# Patient Record
Sex: Male | Born: 1965
Health system: Southern US, Community
[De-identification: ages and names within clinical notes are randomized; demographics above are authoritative.]

## PROBLEM LIST (undated history)

## (undated) DIAGNOSIS — I82409 Acute embolism and thrombosis of unspecified deep veins of unspecified lower extremity: Secondary | ICD-10-CM

## (undated) DIAGNOSIS — E785 Hyperlipidemia, unspecified: Secondary | ICD-10-CM

## (undated) DIAGNOSIS — M109 Gout, unspecified: Secondary | ICD-10-CM

## (undated) DIAGNOSIS — I10 Essential (primary) hypertension: Secondary | ICD-10-CM

## (undated) HISTORY — PX: COLONOSCOPY: SHX174

## (undated) HISTORY — PX: COLON SURGERY: SHX602

## (undated) HISTORY — DX: Essential (primary) hypertension: I10

## (undated) HISTORY — DX: Hyperlipidemia, unspecified: E78.5

## (undated) HISTORY — DX: Gout, unspecified: M10.9

## (undated) HISTORY — PX: CYST EXCISION: SHX5701

## (undated) HISTORY — DX: Acute embolism and thrombosis of unspecified deep veins of unspecified lower extremity: I82.409

---

## 2001-11-05 ENCOUNTER — Emergency Department (HOSPITAL_COMMUNITY): Admission: EM | Admit: 2001-11-05 | Discharge: 2001-11-05 | Payer: Self-pay | Admitting: Emergency Medicine

## 2001-11-07 ENCOUNTER — Other Ambulatory Visit: Admission: RE | Admit: 2001-11-07 | Discharge: 2001-11-07 | Payer: Self-pay | Admitting: Dermatology

## 2001-12-23 ENCOUNTER — Other Ambulatory Visit: Admission: RE | Admit: 2001-12-23 | Discharge: 2001-12-23 | Payer: Self-pay | Admitting: Dermatology

## 2008-05-29 DIAGNOSIS — I2699 Other pulmonary embolism without acute cor pulmonale: Secondary | ICD-10-CM

## 2008-05-29 DIAGNOSIS — I82409 Acute embolism and thrombosis of unspecified deep veins of unspecified lower extremity: Secondary | ICD-10-CM

## 2008-05-29 HISTORY — DX: Other pulmonary embolism without acute cor pulmonale: I26.99

## 2008-05-29 HISTORY — DX: Acute embolism and thrombosis of unspecified deep veins of unspecified lower extremity: I82.409

## 2008-07-06 ENCOUNTER — Emergency Department (HOSPITAL_COMMUNITY): Admission: EM | Admit: 2008-07-06 | Discharge: 2008-07-06 | Payer: Self-pay | Admitting: Emergency Medicine

## 2008-12-06 ENCOUNTER — Ambulatory Visit: Payer: Self-pay | Admitting: Cardiology

## 2009-01-19 ENCOUNTER — Ambulatory Visit: Payer: Self-pay | Admitting: Radiology

## 2009-01-19 ENCOUNTER — Emergency Department (HOSPITAL_BASED_OUTPATIENT_CLINIC_OR_DEPARTMENT_OTHER): Admission: EM | Admit: 2009-01-19 | Discharge: 2009-01-19 | Payer: Self-pay | Admitting: Emergency Medicine

## 2010-09-03 LAB — PROTIME-INR: INR: 2 — ABNORMAL HIGH (ref 0.00–1.49)

## 2013-05-19 ENCOUNTER — Telehealth: Payer: Self-pay | Admitting: Family Medicine

## 2013-05-19 NOTE — Telephone Encounter (Signed)
appt given  

## 2013-05-21 ENCOUNTER — Ambulatory Visit (INDEPENDENT_AMBULATORY_CARE_PROVIDER_SITE_OTHER): Payer: BC Managed Care – PPO | Admitting: General Practice

## 2013-05-21 ENCOUNTER — Encounter: Payer: Self-pay | Admitting: General Practice

## 2013-05-21 VITALS — BP 155/78 | HR 71 | Temp 98.2°F | Ht 71.0 in | Wt 335.0 lb

## 2013-05-21 DIAGNOSIS — I10 Essential (primary) hypertension: Secondary | ICD-10-CM

## 2013-05-21 MED ORDER — LISINOPRIL 10 MG PO TABS: 10.0000 mg | ORAL_TABLET | Freq: Every day | ORAL | Status: DC

## 2013-05-21 MED ORDER — HYDROCHLOROTHIAZIDE 25 MG PO TABS: 25.0000 mg | ORAL_TABLET | Freq: Every day | ORAL | Status: DC

## 2013-05-21 NOTE — Progress Notes (Signed)
   Subjective:    Patient ID: Jeffrey Stark, male    DOB: 1965-12-30, 47 y.o.   MRN: [id number]  HPI Patient present today for follow up of blood pressure. He was recently diagnosed with HTN and started on hctz 12.5mg  daily. Reports blood pressure being checked and ranges 160's/90-106. Reports eating a healthy diet and denies regular exercise.       Review of Systems  Constitutional: Negative for fever and chills.  Respiratory: Negative for chest tightness and shortness of breath.   Cardiovascular: Negative for chest pain and palpitations.  All other systems reviewed and are negative.       Objective:   Physical Exam  Constitutional: He is oriented to person, place, and time. He appears well-developed and well-nourished.  obese  HENT:  Head: Normocephalic and atraumatic.  Right Ear: External ear normal.  Left Ear: External ear normal.  Mouth/Throat: Oropharynx is clear and moist.  Eyes: EOM are normal. Pupils are equal, round, and reactive to light.  Neck: Normal range of motion. Neck supple. No thyromegaly present.  Cardiovascular: Normal rate, regular rhythm and normal heart sounds.   Pulmonary/Chest: Effort normal and breath sounds normal. No respiratory distress. He exhibits no tenderness.  Abdominal: Soft. Bowel sounds are normal. He exhibits no distension. There is no tenderness.  Obese abdomen  Lymphadenopathy:    He has no cervical adenopathy.  Neurological: He is alert and oriented to person, place, and time.  Skin: Skin is warm and dry.  Psychiatric: He has a normal mood and affect.          Assessment & Plan:  1. HTN (hypertension)  - CMP14+EGFR - lisinopril (PRINIVIL,ZESTRIL) 10 MG tablet; Take 1 tablet (10 mg total) by mouth daily.  Dispense: 30 tablet; Refill: 0 - hydrochlorothiazide (HYDRODIURIL) 25 MG tablet; Take 1 tablet (25 mg total) by mouth daily.  Dispense: 30 tablet; Refill: 0 - CBC with Differential -follow up in 2 weeks for blood pressure  recheck  -discussed healthy eating and regular exercise Patient verbalized understanding Coralie Keens, FNP-C

## 2013-05-21 NOTE — Patient Instructions (Signed)

## 2013-05-22 LAB — CMP14+EGFR
Albumin/Globulin Ratio: 1.7 (ref 1.1–2.5)
Calcium: 9.8 mg/dL (ref 8.7–10.2)
Creatinine, Ser: 1.16 mg/dL (ref 0.76–1.27)
GFR calc non Af Amer: 74 mL/min/{1.73_m2} (ref >59)
Globulin, Total: 2.7 g/dL (ref 1.5–4.5)
Sodium: 142 mmol/L (ref 134–144)
Total Protein: 7.3 g/dL (ref 6.0–8.5)

## 2013-05-22 LAB — CBC WITH DIFFERENTIAL/PLATELET
Basophils Absolute: 0 10*3/uL (ref 0.0–0.2)
Eosinophils Absolute: 0 10*3/uL (ref 0.0–0.4)
HCT: 46.3 % (ref 37.5–51.0)
Hemoglobin: 15.3 g/dL (ref 12.6–17.7)
Lymphocytes Absolute: 2.4 10*3/uL (ref 0.7–3.1)
MCH: 27.7 pg (ref 26.6–33.0)
MCHC: 33 g/dL (ref 31.5–35.7)
Monocytes Absolute: 0.6 10*3/uL (ref 0.1–0.9)
Neutrophils Absolute: 3.3 10*3/uL (ref 1.4–7.0)

## 2013-05-26 ENCOUNTER — Telehealth: Payer: Self-pay | Admitting: General Practice

## 2013-05-26 ENCOUNTER — Encounter: Payer: Self-pay | Admitting: General Practice

## 2013-05-26 DIAGNOSIS — I10 Essential (primary) hypertension: Secondary | ICD-10-CM | POA: Insufficient documentation

## 2013-05-26 NOTE — Telephone Encounter (Signed)
Message left for Jeffrey Stark and letter ready for fax, when she provides fax number.

## 2013-05-30 NOTE — Telephone Encounter (Signed)
Spoke with Jeffrey Stark and patient.

## 2013-06-06 ENCOUNTER — Encounter: Payer: Self-pay | Admitting: General Practice

## 2013-06-06 ENCOUNTER — Ambulatory Visit (INDEPENDENT_AMBULATORY_CARE_PROVIDER_SITE_OTHER): Payer: BC Managed Care – PPO | Admitting: General Practice

## 2013-06-06 VITALS — BP 140/74 | HR 70 | Temp 98.2°F | Ht 71.0 in | Wt 338.5 lb

## 2013-06-06 DIAGNOSIS — I10 Essential (primary) hypertension: Secondary | ICD-10-CM

## 2013-06-06 MED ORDER — HYDROCHLOROTHIAZIDE 25 MG PO TABS: 25.0000 mg | ORAL_TABLET | Freq: Every day | ORAL | Status: DC

## 2013-06-06 MED ORDER — LISINOPRIL 10 MG PO TABS: 10.0000 mg | ORAL_TABLET | Freq: Every day | ORAL | Status: DC

## 2013-06-06 NOTE — Progress Notes (Signed)
   Subjective:    Patient ID: Jeffrey Stark, male    DOB: 1965/06/01, 48 y.o.   MRN: [id number]  HPI Patient presents today for blood pressure recheck. Checking blood pressure twice daily and ranges 120's-140/70-80. Denies regular exercise and reports trying to eat healthier. Patient brought his own blood pressure cuff in to see if correlates with office readings.     Review of Systems  Constitutional: Negative for fever and chills.  Respiratory: Negative for cough, chest tightness and shortness of breath.   Cardiovascular: Negative for chest pain, palpitations and leg swelling.  Neurological: Negative for dizziness, weakness and headaches.  All other systems reviewed and are negative.       Objective:   Physical Exam  Constitutional: He is oriented to person, place, and time. He appears well-developed and well-nourished.  Morbidly obese  Cardiovascular: Normal rate, regular rhythm and normal heart sounds.   Pulmonary/Chest: Effort normal and breath sounds normal. No respiratory distress. He exhibits no tenderness.  Neurological: He is alert and oriented to person, place, and time.  Skin: Skin is warm and dry.  Psychiatric: He has a normal mood and affect.          Assessment & Plan:  1. HTN (hypertension) - lisinopril (PRINIVIL,ZESTRIL) 10 MG tablet; Take 1 tablet (10 mg total) by mouth daily.  Dispense: 30 tablet; Refill: 5 - hydrochlorothiazide (HYDRODIURIL) 25 MG tablet; Take 1 tablet (25 mg total) by mouth daily.  Dispense: 30 tablet; Refill: 5 -office cuff reading 137/78 and home cuff correlated with 140/74 -Patient education discussed and provided (exercise to lose weight) -follow up in 3 months for chronic health -Patient verbalized understanding Erby Pian, FNP-C

## 2013-06-06 NOTE — Patient Instructions (Signed)

## 2013-06-11 ENCOUNTER — Telehealth: Payer: Self-pay | Admitting: *Deleted

## 2013-06-11 NOTE — Telephone Encounter (Signed)
Please call [phone]  (re-hab), to have them set a time to test him so he can get back to work.

## 2013-06-11 NOTE — Telephone Encounter (Signed)
Called and message left for return call.

## 2013-06-11 NOTE — Telephone Encounter (Signed)
Patient will call re-hab and find out if they will test him, if not,  He will call Ms. Haliburton to have her speak with them .

## 2013-06-14 NOTE — Telephone Encounter (Signed)
Letter faxed to re-hab and vm was also left for him to assist this patient to return to work.

## 2013-08-19 ENCOUNTER — Encounter: Payer: Self-pay | Admitting: *Deleted

## 2013-09-03 ENCOUNTER — Ambulatory Visit: Payer: BC Managed Care – PPO | Admitting: General Practice

## 2013-09-05 ENCOUNTER — Ambulatory Visit: Payer: BC Managed Care – PPO | Admitting: General Practice

## 2013-09-22 ENCOUNTER — Encounter: Payer: Self-pay | Admitting: General Practice

## 2013-09-22 ENCOUNTER — Ambulatory Visit (INDEPENDENT_AMBULATORY_CARE_PROVIDER_SITE_OTHER): Payer: BC Managed Care – PPO | Admitting: General Practice

## 2013-09-22 VITALS — BP 126/78 | HR 66 | Temp 97.5°F | Ht 71.0 in | Wt 334.0 lb

## 2013-09-22 DIAGNOSIS — I10 Essential (primary) hypertension: Secondary | ICD-10-CM

## 2013-09-22 MED ORDER — HYDROCHLOROTHIAZIDE 25 MG PO TABS: 25.0000 mg | ORAL_TABLET | Freq: Every day | ORAL | Status: DC

## 2013-09-22 MED ORDER — LISINOPRIL 10 MG PO TABS: 10.0000 mg | ORAL_TABLET | Freq: Every day | ORAL | Status: DC

## 2013-09-22 NOTE — Patient Instructions (Signed)

## 2013-09-22 NOTE — Progress Notes (Signed)
   Subjective:    Patient ID: Jeffrey Stark, male    DOB: Jun 15, 1965, 48 y.o.   MRN: [id number]  HPI Patient presents today for follow up of hypertension. Taking medications as prescribed. Eating healthy and plans to begin exercise regimen.     Review of Systems  Constitutional: Negative for fever and chills.  Respiratory: Negative for chest tightness and shortness of breath.   Cardiovascular: Negative for chest pain and palpitations.  Gastrointestinal: Negative for nausea, vomiting, abdominal pain, diarrhea, constipation and blood in stool.  Genitourinary: Negative for hematuria and difficulty urinating.  Neurological: Negative for dizziness, weakness and headaches.       Objective:   Physical Exam  Constitutional: He is oriented to person, place, and time. He appears well-developed and well-nourished.  obese  HENT:  Head: Normocephalic and atraumatic.  Right Ear: External ear normal.  Left Ear: External ear normal.  Mouth/Throat: Oropharynx is clear and moist.  Eyes: Pupils are equal, round, and reactive to light.  Neck: Normal range of motion. Neck supple. No thyromegaly present.  Cardiovascular: Normal rate, regular rhythm and normal heart sounds.   Pulmonary/Chest: Effort normal and breath sounds normal. No respiratory distress. He exhibits no tenderness.  Abdominal: Soft. Bowel sounds are normal. He exhibits no distension. There is no tenderness.  Lymphadenopathy:    He has no cervical adenopathy.  Neurological: He is alert and oriented to person, place, and time.  Skin: Skin is warm and dry.  Psychiatric: He has a normal mood and affect.          Assessment & Plan:  1. Hypertension - CMP14+EGFR; Future - Lipid panel; Future - lisinopril (PRINIVIL,ZESTRIL) 10 MG tablet; Take 1 tablet (10 mg total) by mouth daily.  Dispense: 30 tablet; Refill: 5 - hydrochlorothiazide (HYDRODIURIL) 25 MG tablet; Take 1 tablet (25 mg total) by mouth daily.  Dispense: 30 tablet;  Refill: 5 -Continue all current medications Labs pending F/u in 3 months Discussed benefits of regular exercise and healthy eating Patient verbalized understanding Erby Pian, FNP-C

## 2013-09-25 ENCOUNTER — Encounter: Payer: Self-pay | Admitting: General Practice

## 2013-09-26 ENCOUNTER — Other Ambulatory Visit (INDEPENDENT_AMBULATORY_CARE_PROVIDER_SITE_OTHER): Payer: BC Managed Care – PPO

## 2013-09-26 DIAGNOSIS — I10 Essential (primary) hypertension: Secondary | ICD-10-CM

## 2013-09-26 NOTE — Progress Notes (Signed)
Pt came in for labs only 

## 2013-09-27 LAB — LIPID PANEL
CHOL/HDL RATIO: 5.4 ratio — AB (ref 0.0–5.0)
Cholesterol, Total: 256 mg/dL — ABNORMAL HIGH (ref 100–199)
HDL: 47 mg/dL (ref >39)
LDL Calculated: 185 mg/dL — ABNORMAL HIGH (ref 0–99)
TRIGLYCERIDES: 119 mg/dL (ref 0–149)
VLDL CHOLESTEROL CAL: 24 mg/dL (ref 5–40)

## 2013-09-27 LAB — CMP14+EGFR
A/G RATIO: 1.6 (ref 1.1–2.5)
ALT: 15 IU/L (ref 0–44)
AST: 18 IU/L (ref 0–40)
Albumin: 4.4 g/dL (ref 3.5–5.5)
Alkaline Phosphatase: 86 IU/L (ref 39–117)
BUN/Creatinine Ratio: 14 (ref 9–20)
BUN: 15 mg/dL (ref 6–24)
CALCIUM: 9.4 mg/dL (ref 8.7–10.2)
CO2: 23 mmol/L (ref 18–29)
CREATININE: 1.07 mg/dL (ref 0.76–1.27)
Chloride: 98 mmol/L (ref 97–108)
GFR calc Af Amer: 94 mL/min/{1.73_m2} (ref >59)
GFR, EST NON AFRICAN AMERICAN: 81 mL/min/{1.73_m2} (ref >59)
GLOBULIN, TOTAL: 2.7 g/dL (ref 1.5–4.5)
GLUCOSE: 96 mg/dL (ref 65–99)
Potassium: 4.1 mmol/L (ref 3.5–5.2)
SODIUM: 140 mmol/L (ref 134–144)
TOTAL PROTEIN: 7.1 g/dL (ref 6.0–8.5)
Total Bilirubin: 0.4 mg/dL (ref 0.0–1.2)

## 2014-04-21 ENCOUNTER — Other Ambulatory Visit: Payer: Self-pay

## 2014-04-21 MED ORDER — HYDROCHLOROTHIAZIDE 25 MG PO TABS: 25.0000 mg | ORAL_TABLET | Freq: Every day | ORAL | Status: DC

## 2014-04-21 MED ORDER — LISINOPRIL 10 MG PO TABS: 10.0000 mg | ORAL_TABLET | Freq: Every day | ORAL | Status: DC

## 2014-04-21 NOTE — Telephone Encounter (Signed)
Last ov 4/15. 

## 2014-04-21 NOTE — Telephone Encounter (Signed)
This is okay to refill, but please make sure that patient gets an appointment to be seen for follow-up as soon as possible.

## 2014-04-28 ENCOUNTER — Telehealth: Payer: Self-pay | Admitting: Family Medicine

## 2014-06-01 ENCOUNTER — Telehealth: Payer: Self-pay | Admitting: Family Medicine

## 2014-06-01 MED ORDER — LISINOPRIL 10 MG PO TABS: 10.0000 mg | ORAL_TABLET | Freq: Every day | ORAL | Status: DC

## 2014-06-01 MED ORDER — HYDROCHLOROTHIAZIDE 25 MG PO TABS: 25.0000 mg | ORAL_TABLET | Freq: Every day | ORAL | Status: DC

## 2014-06-01 NOTE — Telephone Encounter (Signed)
DONE

## 2014-06-12 ENCOUNTER — Ambulatory Visit: Payer: BLUE CROSS/BLUE SHIELD | Admitting: Family Medicine

## 2014-07-06 ENCOUNTER — Ambulatory Visit: Payer: BLUE CROSS/BLUE SHIELD | Admitting: Family Medicine

## 2014-07-13 ENCOUNTER — Ambulatory Visit: Payer: BLUE CROSS/BLUE SHIELD | Admitting: Family Medicine

## 2014-07-13 ENCOUNTER — Telehealth: Payer: Self-pay | Admitting: *Deleted

## 2014-07-20 ENCOUNTER — Other Ambulatory Visit: Payer: Self-pay | Admitting: Family Medicine

## 2014-07-27 ENCOUNTER — Other Ambulatory Visit: Payer: Self-pay | Admitting: Family Medicine

## 2014-07-27 MED ORDER — HYDROCHLOROTHIAZIDE 25 MG PO TABS: 25.0000 mg | ORAL_TABLET | Freq: Every day | ORAL | Status: DC

## 2014-07-27 NOTE — Telephone Encounter (Signed)
done

## 2014-08-10 ENCOUNTER — Ambulatory Visit: Payer: BLUE CROSS/BLUE SHIELD | Admitting: Family Medicine

## 2014-08-27 ENCOUNTER — Ambulatory Visit (INDEPENDENT_AMBULATORY_CARE_PROVIDER_SITE_OTHER): Payer: BLUE CROSS/BLUE SHIELD | Admitting: Family Medicine

## 2014-08-27 ENCOUNTER — Other Ambulatory Visit: Payer: Self-pay | Admitting: Family Medicine

## 2014-08-27 ENCOUNTER — Encounter: Payer: Self-pay | Admitting: Family Medicine

## 2014-08-27 VITALS — BP 126/78 | HR 86 | Temp 98.8°F | Ht 71.0 in | Wt 297.0 lb

## 2014-08-27 DIAGNOSIS — I10 Essential (primary) hypertension: Secondary | ICD-10-CM | POA: Diagnosis not present

## 2014-08-27 DIAGNOSIS — Z0000 Encounter for general adult medical examination without abnormal findings: Secondary | ICD-10-CM

## 2014-08-27 MED ORDER — HYDROCHLOROTHIAZIDE 25 MG PO TABS: 25.0000 mg | ORAL_TABLET | Freq: Every day | ORAL | Status: DC

## 2014-08-27 MED ORDER — LISINOPRIL 10 MG PO TABS
10.0000 mg | ORAL_TABLET | Freq: Every day | ORAL | Status: DC
Start: 2014-08-27 — End: 2015-12-09
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
## 2014-08-28 ENCOUNTER — Other Ambulatory Visit: Payer: Self-pay | Admitting: Family Medicine

## 2014-08-28 LAB — CBC WITH DIFFERENTIAL/PLATELET
BASOS ABS: 0 10*3/uL (ref 0.0–0.2)
BASOS: 0 %
EOS ABS: 0.1 10*3/uL (ref 0.0–0.4)
Eos: 1 %
HCT: 42.7 % (ref 37.5–51.0)
HEMOGLOBIN: 14 g/dL (ref 12.6–17.7)
IMMATURE GRANS (ABS): 0 10*3/uL (ref 0.0–0.1)
Immature Granulocytes: 0 %
LYMPHS: 29 %
Lymphocytes Absolute: 2.5 10*3/uL (ref 0.7–3.1)
MCH: 26.6 pg (ref 26.6–33.0)
MCHC: 32.8 g/dL (ref 31.5–35.7)
MCV: 81 fL (ref 79–97)
MONOS ABS: 0.6 10*3/uL (ref 0.1–0.9)
Monocytes: 7 %
NEUTROS ABS: 5.5 10*3/uL (ref 1.4–7.0)
NEUTROS PCT: 63 %
Platelets: 329 10*3/uL (ref 150–379)
RBC: 5.27 x10E6/uL (ref 4.14–5.80)
RDW: 15.5 % — AB (ref 12.3–15.4)
WBC: 8.6 10*3/uL (ref 3.4–10.8)

## 2014-08-28 LAB — THYROID PANEL WITH TSH
FREE THYROXINE INDEX: 2.2 (ref 1.2–4.9)
T3 UPTAKE RATIO: 25 % (ref 24–39)
T4, Total: 8.8 ug/dL (ref 4.5–12.0)
TSH: 0.816 u[IU]/mL (ref 0.450–4.500)

## 2014-08-28 LAB — CMP14+EGFR
ALK PHOS: 98 IU/L (ref 39–117)
ALT: 15 IU/L (ref 0–44)
AST: 17 IU/L (ref 0–40)
Albumin/Globulin Ratio: 1.5 (ref 1.1–2.5)
Albumin: 4.5 g/dL (ref 3.5–5.5)
BILIRUBIN TOTAL: 0.3 mg/dL (ref 0.0–1.2)
BUN / CREAT RATIO: 13 (ref 9–20)
BUN: 14 mg/dL (ref 6–24)
CHLORIDE: 100 mmol/L (ref 97–108)
CO2: 24 mmol/L (ref 18–29)
Calcium: 9.8 mg/dL (ref 8.7–10.2)
Creatinine, Ser: 1.09 mg/dL (ref 0.76–1.27)
GFR calc non Af Amer: 79 mL/min/{1.73_m2} (ref >59)
GFR, EST AFRICAN AMERICAN: 91 mL/min/{1.73_m2} (ref >59)
Globulin, Total: 3.1 g/dL (ref 1.5–4.5)
Glucose: 102 mg/dL — ABNORMAL HIGH (ref 65–99)
POTASSIUM: 4 mmol/L (ref 3.5–5.2)
SODIUM: 141 mmol/L (ref 134–144)
Total Protein: 7.6 g/dL (ref 6.0–8.5)

## 2014-08-28 LAB — LIPID PANEL
CHOL/HDL RATIO: 5.3 ratio — AB (ref 0.0–5.0)
CHOLESTEROL TOTAL: 270 mg/dL — AB (ref 100–199)
HDL: 51 mg/dL (ref >39)
LDL CALC: 199 mg/dL — AB (ref 0–99)
Triglycerides: 102 mg/dL (ref 0–149)
VLDL CHOLESTEROL CAL: 20 mg/dL (ref 5–40)

## 2014-08-28 LAB — PSA, TOTAL AND FREE
PSA FREE: 0.08 ng/mL
PSA, Free Pct: 13.3 %
PSA: 0.6 ng/mL (ref 0.0–4.0)

## 2014-08-28 LAB — VITAMIN D 25 HYDROXY (VIT D DEFICIENCY, FRACTURES): VIT D 25 HYDROXY: 7.3 ng/mL — AB (ref 30.0–100.0)

## 2014-08-28 MED ORDER — ATORVASTATIN CALCIUM 40 MG PO TABS: 40.0000 mg | ORAL_TABLET | Freq: Every day | ORAL | Status: DC

## 2014-08-31 ENCOUNTER — Telehealth: Payer: Self-pay | Admitting: *Deleted

## 2014-08-31 NOTE — Telephone Encounter (Signed)
-----   Message from Claretta Fraise, MD sent at 08/28/2014  4:12 PM EDT ----- Your cholesterol is too high. It would benefit from medication. Without treatment, risk for heart attack, stroke and other medical conditions is high. I recommend atorvastatin. 40 mg daily with supper should reduce your risk significantly.

## 2014-09-01 ENCOUNTER — Other Ambulatory Visit: Payer: Self-pay | Admitting: Family Medicine

## 2014-09-01 ENCOUNTER — Telehealth: Payer: Self-pay | Admitting: Family Medicine

## 2014-09-01 MED ORDER — VITAMIN D (ERGOCALCIFEROL) 1.25 MG (50000 UNIT) PO CAPS: 50000.0000 [IU] | ORAL_CAPSULE | ORAL | Status: DC

## 2014-09-01 NOTE — Telephone Encounter (Signed)
Tc to pt that cholesterol & Vit D Rx's sent to pharmacy

## 2014-09-01 NOTE — Telephone Encounter (Signed)
Vitamin D quite low. Needs supplement.Use 50,000 units twice weekly for two months. Then switch to OTC Vitamin D 2000 units daily. REcehck vitamin D level with office visit in 6 months.

## 2014-12-18 ENCOUNTER — Encounter: Payer: Self-pay | Admitting: *Deleted

## 2015-02-24 ENCOUNTER — Ambulatory Visit: Payer: BLUE CROSS/BLUE SHIELD | Admitting: Family Medicine

## 2015-02-25 ENCOUNTER — Encounter: Payer: Self-pay | Admitting: Family Medicine

## 2015-03-02 ENCOUNTER — Ambulatory Visit: Payer: BLUE CROSS/BLUE SHIELD | Admitting: Family Medicine

## 2015-03-18 ENCOUNTER — Encounter: Payer: Self-pay | Admitting: Family Medicine

## 2015-03-18 ENCOUNTER — Ambulatory Visit (INDEPENDENT_AMBULATORY_CARE_PROVIDER_SITE_OTHER): Payer: BLUE CROSS/BLUE SHIELD | Admitting: Family Medicine

## 2015-03-18 VITALS — BP 147/73 | HR 64 | Temp 98.2°F | Ht 71.0 in | Wt 297.8 lb

## 2015-03-18 DIAGNOSIS — E785 Hyperlipidemia, unspecified: Secondary | ICD-10-CM | POA: Diagnosis not present

## 2015-03-18 DIAGNOSIS — R7989 Other specified abnormal findings of blood chemistry: Secondary | ICD-10-CM

## 2015-03-18 DIAGNOSIS — M25561 Pain in right knee: Secondary | ICD-10-CM | POA: Diagnosis not present

## 2015-03-18 DIAGNOSIS — I10 Essential (primary) hypertension: Secondary | ICD-10-CM | POA: Diagnosis not present

## 2015-03-18 MED ORDER — VITAMIN D (ERGOCALCIFEROL) 1.25 MG (50000 UNIT) PO CAPS: 50000.0000 [IU] | ORAL_CAPSULE | ORAL | Status: DC

## 2015-03-18 MED ORDER — ATORVASTATIN CALCIUM 40 MG PO TABS: 40.0000 mg | ORAL_TABLET | Freq: Every day | ORAL | Status: DC

## 2015-03-18 NOTE — Progress Notes (Signed)
BP 147/73 mmHg  Pulse 64  Temp(Src) 98.2 F (36.8 C) (Oral)  Ht _0  (1.803 m)  Wt 297 lb 12.8 oz (135.081 kg)  BMI 41.55 kg/m2   Subjective:    Patient ID: Jeffrey Stark, male    DOB: 11-25-1965, 49 y.o.   MRN: [id number]  HPI: Garnett Nunziata is a 49 y.o. male presenting on 03/18/2015 for Hypertension; Knee Pain; and Gout   HPI Hypertension Patient comes in today for hypertension recheck. His blood pressure is elevated at 147/73 today but he does admit that he spoke for coming in today. Normally his blood pressures well controlled on this medication per patient. Patient denies headaches, blurred vision, chest pains, shortness of breath, or weakness. Denies any side effects from medication and is content with current medication.  Hyperlipidemia Patient was diagnosed with hyperlipidemia and elevated LDL of 199 in March of this year. He said he never received the message and never received the medication. For this reason we will not recheck today but will given the medication and then recheck at his next visit.  Low vitamin D Patient was also diagnosed with low vitamin D in March of this year. It is significantly low at 7 and he never received the medication for it.  Knee pain Patient is having right anterior lateral knee pain. About 4 days ago he strained his knee and was having significant pain from that, he went to the emergency room and received a brace and some kind of anti-inflammatory. He does not remember the name of it. Today he feels much improved does not have any pain at all and would like to have a note to return to work.   Relevant past medical, surgical, family and social history reviewed and updated as indicated. Interim medical history since our last visit reviewed. Allergies and medications reviewed and updated.  Review of Systems  Constitutional: Negative for fever.  HENT: Negative for ear discharge and ear pain.   Eyes: Negative for discharge and visual  disturbance.  Respiratory: Negative for shortness of breath and wheezing.   Cardiovascular: Negative for chest pain and leg swelling.  Gastrointestinal: Negative for abdominal pain, diarrhea and constipation.  Genitourinary: Negative for difficulty urinating.  Musculoskeletal: Positive for arthralgias. Negative for back pain and gait problem.  Skin: Negative for rash.  Neurological: Negative for dizziness, syncope, light-headedness and headaches.  All other systems reviewed and are negative.  Per HPI unless specifically indicated above     Medication List       This list is accurate as of: 03/18/15 10:13 AM.  Always use your most recent med list.               atorvastatin 40 MG tablet  Commonly known as:  LIPITOR  Take 1 tablet (40 mg total) by mouth daily. For cholesterol     etodolac 300 MG capsule  Commonly known as:  LODINE  Take 1 capsule by mouth. Every 6-8 hours as need for pain     hydrochlorothiazide 25 MG tablet  Commonly known as:  HYDRODIURIL  Take 1 tablet (25 mg total) by mouth daily.     lisinopril 10 MG tablet  Commonly known as:  PRINIVIL,ZESTRIL  Take 1 tablet (10 mg total) by mouth daily.     Vitamin D (Ergocalciferol) 50000 UNITS Caps capsule  Commonly known as:  DRISDOL  Take 1 capsule (50,000 Units total) by mouth 2 (two) times a week.  Objective:    BP 147/73 mmHg  Pulse 64  Temp(Src) 98.2 F (36.8 C) (Oral)  Ht _0  (1.803 m)  Wt 297 lb 12.8 oz (135.081 kg)  BMI 41.55 kg/m2  Wt Readings from Last 3 Encounters:  03/18/15 297 lb 12.8 oz (135.081 kg)  08/27/14 297 lb (134.718 kg)  09/22/13 334 lb (151.501 kg)    Physical Exam  Constitutional: He is oriented to person, place, and time. He appears well-developed and well-nourished. No distress.  Eyes: Conjunctivae and EOM are normal. Pupils are equal, round, and reactive to light. Right eye exhibits no discharge. No scleral icterus.  Cardiovascular: Normal rate, regular  rhythm, normal heart sounds and intact distal pulses.   No murmur heard. Pulmonary/Chest: Effort normal and breath sounds normal. No respiratory distress. He has no wheezes.  Abdominal: He exhibits no distension.  Musculoskeletal: Normal range of motion. He exhibits no edema.       Right knee: He exhibits normal range of motion, no swelling, no effusion, no ecchymosis, no deformity, no erythema, normal alignment, no LCL laxity, normal patellar mobility, no bony tenderness, normal meniscus and no MCL laxity. No tenderness (no tenderness to palpation today) found.  Neurological: He is alert and oriented to person, place, and time. Coordination normal.  Skin: Skin is warm and dry. No rash noted. He is not diaphoretic.  Psychiatric: He has a normal mood and affect. His behavior is normal.  Nursing note and vitals reviewed.   Results for orders placed or performed in visit on 08/27/14  CMP14+EGFR  Result Value Ref Range   Glucose 102 (H) 65 - 99 mg/dL   BUN 14 6 - 24 mg/dL   Creatinine, Ser 1.09 0.76 - 1.27 mg/dL   GFR calc non Af Amer 79 >59 mL/min/1.73   GFR calc Af Amer 91 >59 mL/min/1.73   BUN/Creatinine Ratio 13 9 - 20   Sodium 141 134 - 144 mmol/L   Potassium 4.0 3.5 - 5.2 mmol/L   Chloride 100 97 - 108 mmol/L   CO2 24 18 - 29 mmol/L   Calcium 9.8 8.7 - 10.2 mg/dL   Total Protein 7.6 6.0 - 8.5 g/dL   Albumin 4.5 3.5 - 5.5 g/dL   Globulin, Total 3.1 1.5 - 4.5 g/dL   Albumin/Globulin Ratio 1.5 1.1 - 2.5   Bilirubin Total 0.3 0.0 - 1.2 mg/dL   Alkaline Phosphatase 98 39 - 117 IU/L   AST 17 0 - 40 IU/L   ALT 15 0 - 44 IU/L  Lipid panel  Result Value Ref Range   Cholesterol, Total 270 (H) 100 - 199 mg/dL   Triglycerides 102 0 - 149 mg/dL   HDL 51 >39 mg/dL   VLDL Cholesterol Cal 20 5 - 40 mg/dL   LDL Calculated 199 (H) 0 - 99 mg/dL   Comment: Comment    Chol/HDL Ratio 5.3 (H) 0.0 - 5.0 ratio units  PSA, total and free  Result Value Ref Range   PSA 0.6 0.0 - 4.0 ng/mL   PSA,  Free 0.08 N/A ng/mL   PSA, Free Pct 13.3 %  Thyroid Panel With TSH  Result Value Ref Range   TSH 0.816 0.450 - 4.500 uIU/mL   T4, Total 8.8 4.5 - 12.0 ug/dL   T3 Uptake Ratio 25 24 - 39 %   Free Thyroxine Index 2.2 1.2 - 4.9  Vit D  25 hydroxy (rtn osteoporosis monitoring)  Result Value Ref Range   Vit D, 25-Hydroxy 7.3 (L)  30.0 - 100.0 ng/mL      Assessment & Plan:   Problem List Items Addressed This Visit      Cardiovascular and Mediastinum   Hypertension    Did not take medication today we'll recheck in 2 weeks.      Relevant Medications   atorvastatin (LIPITOR) 40 MG tablet     Other   Knee pain, right - Primary    Resolved today, give note to go back to work.      Hyperlipidemia LDL goal <130   Relevant Medications   atorvastatin (LIPITOR) 40 MG tablet   Low serum vitamin D   Relevant Medications   Vitamin D, Ergocalciferol, (DRISDOL) 50000 UNITS CAPS capsule       Follow up plan: Return in about 6 months (around 09/16/2015), or if symptoms worsen or fail to improve, for HTN, also come in 2 weeks for BP nurse check.  Caryl Pina, MD [facility] 03/18/2015, 10:13 AM

## 2015-03-18 NOTE — Assessment & Plan Note (Signed)
Resolved today, give note to go back to work.

## 2015-03-18 NOTE — Assessment & Plan Note (Signed)
Did not take medication today we'll recheck in 2 weeks.

## 2015-04-01 ENCOUNTER — Ambulatory Visit (INDEPENDENT_AMBULATORY_CARE_PROVIDER_SITE_OTHER): Payer: BLUE CROSS/BLUE SHIELD | Admitting: *Deleted

## 2015-04-01 VITALS — BP 127/72 | HR 82

## 2015-04-01 DIAGNOSIS — I10 Essential (primary) hypertension: Secondary | ICD-10-CM

## 2015-04-01 NOTE — Patient Instructions (Signed)
Hypertension Hypertension, commonly called high blood pressure, is when the force of blood pumping through your arteries is too strong. Your arteries are the blood vessels that carry blood from your heart throughout your body. A blood pressure reading consists of a higher number over a lower number, such as 110/72. The higher number (systolic) is the pressure inside your arteries when your heart pumps. The lower number (diastolic) is the pressure inside your arteries when your heart relaxes. Ideally you want your blood pressure below 120/80. Hypertension forces your heart to work harder to pump blood. Your arteries may become narrow or stiff. Having untreated or uncontrolled hypertension can cause heart attack, stroke, kidney disease, and other problems. RISK FACTORS Some risk factors for high blood pressure are controllable. Others are not.  Risk factors you cannot control include:   Race. You may be at higher risk if you are African American.  Age. Risk increases with age.  Gender. Men are at higher risk than women before age 45 years. After age 65, women are at higher risk than men. Risk factors you can control include:  Not getting enough exercise or physical activity.  Being overweight.  Getting too much fat, sugar, calories, or salt in your diet.  Drinking too much alcohol. SIGNS AND SYMPTOMS Hypertension does not usually cause signs or symptoms. Extremely high blood pressure (hypertensive crisis) may cause headache, anxiety, shortness of breath, and nosebleed. DIAGNOSIS To check if you have hypertension, your health care provider will measure your blood pressure while you are seated, with your arm held at the level of your heart. It should be measured at least twice using the same arm. Certain conditions can cause a difference in blood pressure between your right and left arms. A blood pressure reading that is higher than normal on one occasion does not mean that you need treatment. If  it is not clear whether you have high blood pressure, you may be asked to return on a different day to have your blood pressure checked again. Or, you may be asked to monitor your blood pressure at home for 1 or more weeks. TREATMENT Treating high blood pressure includes making lifestyle changes and possibly taking medicine. Living a healthy lifestyle can help lower high blood pressure. You may need to change some of your habits. Lifestyle changes may include:  Following the DASH diet. This diet is high in fruits, vegetables, and whole grains. It is low in salt, red meat, and added sugars.  Keep your sodium intake below 2,300 mg per day.  Getting at least 30-45 minutes of aerobic exercise at least 4 times per week.  Losing weight if necessary.  Not smoking.  Limiting alcoholic beverages.  Learning ways to reduce stress. Your health care provider may prescribe medicine if lifestyle changes are not enough to get your blood pressure under control, and if one of the following is true:  You are 18-59 years of age and your systolic blood pressure is above 140.  You are 60 years of age or older, and your systolic blood pressure is above 150.  Your diastolic blood pressure is above 90.  You have diabetes, and your systolic blood pressure is over 140 or your diastolic blood pressure is over 90.  You have kidney disease and your blood pressure is above 140/90.  You have heart disease and your blood pressure is above 140/90. Your personal target blood pressure may vary depending on your medical conditions, your age, and other factors. HOME CARE INSTRUCTIONS    Have your blood pressure rechecked as directed by your health care provider.   Take medicines only as directed by your health care provider. Follow the directions carefully. Blood pressure medicines must be taken as prescribed. The medicine does not work as well when you skip doses. Skipping doses also puts you at risk for  problems.  Do not smoke.   Monitor your blood pressure at home as directed by your health care provider. SEEK MEDICAL CARE IF:   You think you are having a reaction to medicines taken.  You have recurrent headaches or feel dizzy.  You have swelling in your ankles.  You have trouble with your vision. SEEK IMMEDIATE MEDICAL CARE IF:  You develop a severe headache or confusion.  You have unusual weakness, numbness, or feel faint.  You have severe chest or abdominal pain.  You vomit repeatedly.  You have trouble breathing. MAKE SURE YOU:   Understand these instructions.  Will watch your condition.  Will get help right away if you are not doing well or get worse.   This information is not intended to replace advice given to you by your health care provider. Make sure you discuss any questions you have with your health care provider.   Document Released: 05/15/2005 Document Revised: 09/29/2014 Document Reviewed: 03/07/2013 Elsevier Interactive Patient Education 2016 [employer]  

## 2015-04-01 NOTE — Progress Notes (Signed)
Patient is here today for a BP check. Patients bp BP 127/72 mmHg  Pulse 82.

## 2015-09-17 ENCOUNTER — Ambulatory Visit (INDEPENDENT_AMBULATORY_CARE_PROVIDER_SITE_OTHER): Payer: BLUE CROSS/BLUE SHIELD

## 2015-09-17 ENCOUNTER — Encounter: Payer: Self-pay | Admitting: Family Medicine

## 2015-09-17 ENCOUNTER — Ambulatory Visit (INDEPENDENT_AMBULATORY_CARE_PROVIDER_SITE_OTHER): Payer: BLUE CROSS/BLUE SHIELD | Admitting: Family Medicine

## 2015-09-17 ENCOUNTER — Encounter (INDEPENDENT_AMBULATORY_CARE_PROVIDER_SITE_OTHER): Payer: Self-pay

## 2015-09-17 VITALS — BP 102/60 | HR 71 | Temp 98.3°F | Ht 71.0 in | Wt 310.0 lb

## 2015-09-17 DIAGNOSIS — M25561 Pain in right knee: Secondary | ICD-10-CM

## 2015-09-17 DIAGNOSIS — M25562 Pain in left knee: Secondary | ICD-10-CM

## 2015-09-17 DIAGNOSIS — I10 Essential (primary) hypertension: Secondary | ICD-10-CM | POA: Diagnosis not present

## 2015-09-17 NOTE — Progress Notes (Signed)
BP 102/60 mmHg  Pulse 71  Temp(Src) 98.3 F (36.8 C) (Oral)  Ht 5' 11"  (1.803 m)  Wt 310 lb (140.615 kg)  BMI 43.26 kg/m2   Subjective:    Patient ID: Jeffrey Stark, male    DOB: 12/22/1965, 50 y.o.   MRN: [id number]  HPI: Jeffrey Stark is a 50 y.o. male presenting on 09/17/2015 for Hypertension and Arthritis   HPI Hypertension Patient is coming in today for hypertension recheck. His blood pressure today is 102/60. He is currently on hydrochlorothiazide 25 mg and lisinopril 10 mg. Discussed lightheadedness or dizziness and he denies having any with blood pressures been lower. Patient denies headaches, blurred vision, chest pains, shortness of breath, or weakness. Denies any side effects from medication and is content with current medication.   Bilateral knee pain Patient is having bilateral knee pain. Anterior medially is the worst. The knee pain is something that's been going on for at least a year. The pain is stiffer in the a.m. Patient is taking anti-inflammatories and Tylenol for the pain and feels like it's helping. He does well when he is up and moving around but just has a lot of issues after resting periods or sleeping at night getting up and moving initially. The stiffness goes away in 5-10 minutes. He denies any pain outside of when he is having the stiffness. He has never had x-rays.  Relevant past medical, surgical, family and social history reviewed and updated as indicated. Interim medical history since our last visit reviewed. Allergies and medications reviewed and updated.  Review of Systems  Constitutional: Negative for fever and chills.  HENT: Negative for ear discharge and ear pain.   Eyes: Negative for discharge and visual disturbance.  Respiratory: Negative for shortness of breath and wheezing.   Cardiovascular: Negative for chest pain and leg swelling.  Gastrointestinal: Negative for abdominal pain, diarrhea and constipation.  Genitourinary: Negative for  difficulty urinating.  Musculoskeletal: Positive for joint swelling and arthralgias. Negative for myalgias, back pain and gait problem.  Skin: Negative for color change, rash and wound.  Neurological: Negative for dizziness, syncope, light-headedness and headaches.  All other systems reviewed and are negative.   Per HPI unless specifically indicated above     Medication List       This list is accurate as of: 09/17/15  4:34 PM.  Always use your most recent med list.               atorvastatin 40 MG tablet  Commonly known as:  LIPITOR  Take 1 tablet (40 mg total) by mouth daily. For cholesterol     hydrochlorothiazide 25 MG tablet  Commonly known as:  HYDRODIURIL  Take 1 tablet (25 mg total) by mouth daily.     lisinopril 10 MG tablet  Commonly known as:  PRINIVIL,ZESTRIL  Take 1 tablet (10 mg total) by mouth daily.           Objective:    BP 102/60 mmHg  Pulse 71  Temp(Src) 98.3 F (36.8 C) (Oral)  Ht 5' 11"  (1.803 m)  Wt 310 lb (140.615 kg)  BMI 43.26 kg/m2  Wt Readings from Last 3 Encounters:  09/17/15 310 lb (140.615 kg)  03/18/15 297 lb 12.8 oz (135.081 kg)  08/27/14 297 lb (134.718 kg)    Physical Exam  Constitutional: He is oriented to person, place, and time. He appears well-developed and well-nourished. No distress.  Eyes: Conjunctivae and EOM are normal. Pupils are equal, round, and reactive  to light. Right eye exhibits no discharge. No scleral icterus.  Cardiovascular: Normal rate, regular rhythm, normal heart sounds and intact distal pulses.   No murmur heard. Pulmonary/Chest: Effort normal and breath sounds normal. No respiratory distress. He has no wheezes.  Abdominal: He exhibits no distension.  Musculoskeletal: Normal range of motion. He exhibits tenderness. He exhibits no edema.       Right knee: He exhibits effusion (Mild). He exhibits normal range of motion, no swelling, no deformity, no laceration, no erythema, no LCL laxity, normal patellar  mobility, normal meniscus and no MCL laxity. Tenderness found. Medial joint line tenderness noted.       Left knee: He exhibits normal range of motion, no swelling, no effusion, no erythema, normal alignment, no LCL laxity, normal patellar mobility, normal meniscus and no MCL laxity. Tenderness found. Medial joint line tenderness noted. No lateral joint line, no MCL, no LCL and no patellar tendon tenderness noted.  Neurological: He is alert and oriented to person, place, and time. Coordination normal.  Skin: Skin is warm and dry. No rash noted. He is not diaphoretic.  Psychiatric: He has a normal mood and affect. His behavior is normal.  Vitals reviewed.   Results for orders placed or performed in visit on 08/27/14  CMP14+EGFR  Result Value Ref Range   Glucose 102 (H) 65 - 99 mg/dL   BUN 14 6 - 24 mg/dL   Creatinine, Ser 1.09 0.76 - 1.27 mg/dL   GFR calc non Af Amer 79 >59 mL/min/1.73   GFR calc Af Amer 91 >59 mL/min/1.73   BUN/Creatinine Ratio 13 9 - 20   Sodium 141 134 - 144 mmol/L   Potassium 4.0 3.5 - 5.2 mmol/L   Chloride 100 97 - 108 mmol/L   CO2 24 18 - 29 mmol/L   Calcium 9.8 8.7 - 10.2 mg/dL   Total Protein 7.6 6.0 - 8.5 g/dL   Albumin 4.5 3.5 - 5.5 g/dL   Globulin, Total 3.1 1.5 - 4.5 g/dL   Albumin/Globulin Ratio 1.5 1.1 - 2.5   Bilirubin Total 0.3 0.0 - 1.2 mg/dL   Alkaline Phosphatase 98 39 - 117 IU/L   AST 17 0 - 40 IU/L   ALT 15 0 - 44 IU/L  Lipid panel  Result Value Ref Range   Cholesterol, Total 270 (H) 100 - 199 mg/dL   Triglycerides 102 0 - 149 mg/dL   HDL 51 >39 mg/dL   VLDL Cholesterol Cal 20 5 - 40 mg/dL   LDL Calculated 199 (H) 0 - 99 mg/dL   Comment: Comment    Chol/HDL Ratio 5.3 (H) 0.0 - 5.0 ratio units  PSA, total and free  Result Value Ref Range   PSA 0.6 0.0 - 4.0 ng/mL   PSA, Free 0.08 N/A ng/mL   PSA, Free Pct 13.3 %  Thyroid Panel With TSH  Result Value Ref Range   TSH 0.816 0.450 - 4.500 uIU/mL   T4, Total 8.8 4.5 - 12.0 ug/dL   T3  Uptake Ratio 25 24 - 39 %   Free Thyroxine Index 2.2 1.2 - 4.9  Vit D  25 hydroxy (rtn osteoporosis monitoring)  Result Value Ref Range   Vit D, 25-Hydroxy 7.3 (L) 30.0 - 100.0 ng/mL      Assessment & Plan:   Problem List Items Addressed This Visit      Cardiovascular and Mediastinum   Hypertension - Primary    Other Visit Diagnoses    Knee pain, bilateral  Chronic, Stefan a.m., likely osteoarthritis, we'll do x-rays    Relevant Orders    DG Knee Bilateral Standing AP (Completed)        Follow up plan: Return in about 6 months (around 03/18/2016), or if symptoms worsen or fail to improve, for Hypertension and cholesterol.  Counseling provided for all of the vaccine components Orders Placed This Encounter  Procedures  . DG Knee 1-2 Views Left  . DG Knee 1-2 Views Right    Caryl Pina, MD [facility] 09/17/2015, 4:34 PM

## 2015-12-09 ENCOUNTER — Other Ambulatory Visit: Payer: Self-pay | Admitting: Family Medicine

## 2015-12-11 ENCOUNTER — Other Ambulatory Visit: Payer: Self-pay | Admitting: Family Medicine

## 2016-02-23 ENCOUNTER — Encounter: Payer: Self-pay | Admitting: *Deleted

## 2016-03-10 ENCOUNTER — Ambulatory Visit: Payer: BLUE CROSS/BLUE SHIELD | Admitting: Family Medicine

## 2016-03-23 ENCOUNTER — Ambulatory Visit: Payer: BLUE CROSS/BLUE SHIELD | Admitting: Family Medicine

## 2016-04-13 ENCOUNTER — Ambulatory Visit: Payer: BLUE CROSS/BLUE SHIELD | Admitting: Family Medicine

## 2016-04-13 ENCOUNTER — Encounter: Payer: Self-pay | Admitting: Family Medicine

## 2016-04-13 ENCOUNTER — Ambulatory Visit (INDEPENDENT_AMBULATORY_CARE_PROVIDER_SITE_OTHER): Payer: BLUE CROSS/BLUE SHIELD | Admitting: Family Medicine

## 2016-04-13 VITALS — BP 124/76 | HR 84 | Temp 97.4°F | Ht 71.0 in | Wt 299.5 lb

## 2016-04-13 DIAGNOSIS — I10 Essential (primary) hypertension: Secondary | ICD-10-CM | POA: Diagnosis not present

## 2016-04-13 DIAGNOSIS — Z125 Encounter for screening for malignant neoplasm of prostate: Secondary | ICD-10-CM | POA: Diagnosis not present

## 2016-04-13 DIAGNOSIS — E785 Hyperlipidemia, unspecified: Secondary | ICD-10-CM

## 2016-04-13 NOTE — Progress Notes (Signed)
BP 124/76   Pulse 84   Temp 97.4 F (36.3 C) (Oral)   Ht 5' 11"  (1.803 m)   Wt 299 lb 8 oz (135.9 kg)   BMI 41.77 kg/m    Subjective:    Patient ID: Jeffrey Stark, male    DOB: 16-Sep-1965, 50 y.o.   MRN: [id number]  HPI: Jeffrey Stark is a 50 y.o. male presenting on 04/13/2016 for Hyperlipidemia (followup) and Hypertension   HPI Hypertension recheck Patient is coming in for hypertension recheck. His blood pressure today is 124/76. He is currently on lisinopril and hydrochlorothiazide. Patient denies headaches, blurred vision, chest pains, shortness of breath, or weakness. Denies any side effects from medication and is content with current medication.   Hyperlipidemia Patient is coming in for cholesterol recheck as well. He is currently on Lipitor 40 mg. He denies any myalgias or known liver problems. He feels like he's been doing very well on the medication. He is coming in fasting today and would like to do fasting labs as well today.  Relevant past medical, surgical, family and social history reviewed and updated as indicated. Interim medical history since our last visit reviewed. Allergies and medications reviewed and updated.  Review of Systems  Constitutional: Negative for chills and fever.  Respiratory: Negative for shortness of breath and wheezing.   Cardiovascular: Negative for chest pain and leg swelling.  Musculoskeletal: Negative for back pain and gait problem.  Skin: Negative for rash.  Neurological: Negative for dizziness, weakness, light-headedness and numbness.  Psychiatric/Behavioral: Negative for decreased concentration and dysphoric mood.  All other systems reviewed and are negative.  Per HPI unless specifically indicated above     Objective:    BP 124/76   Pulse 84   Temp 97.4 F (36.3 C) (Oral)   Ht 5' 11"  (1.803 m)   Wt 299 lb 8 oz (135.9 kg)   BMI 41.77 kg/m   Wt Readings from Last 3 Encounters:  04/13/16 299 lb 8 oz (135.9 kg)  09/17/15 (!)  310 lb (140.6 kg)  03/18/15 297 lb 12.8 oz (135.1 kg)    Physical Exam  Constitutional: He is oriented to person, place, and time. He appears well-developed and well-nourished. No distress.  Eyes: Conjunctivae are normal. Right eye exhibits no discharge. Left eye exhibits no discharge. No scleral icterus.  Cardiovascular: Normal rate, regular rhythm, normal heart sounds and intact distal pulses.   No murmur heard. Pulmonary/Chest: Effort normal and breath sounds normal. No respiratory distress. He has no wheezes. He has no rales.  Abdominal: Soft. Bowel sounds are normal. He exhibits no distension. There is no tenderness. There is no rebound and no guarding.  Musculoskeletal: Normal range of motion. He exhibits no edema.  Neurological: He is alert and oriented to person, place, and time. Coordination normal.  Skin: Skin is warm and dry. No rash noted. He is not diaphoretic.  Psychiatric: He has a normal mood and affect. His behavior is normal.  Nursing note and vitals reviewed.     Assessment & Plan:   Problem List Items Addressed This Visit      Cardiovascular and Mediastinum   Hypertension   Relevant Orders   CMP14+EGFR     Other   Hyperlipidemia LDL goal <130 - Primary   Relevant Orders   Lipid panel    Other Visit Diagnoses    Prostate cancer screening       Relevant Orders   PSA, total and free  Follow up plan: Return in about 6 months (around 10/11/2016), or if symptoms worsen or fail to improve, for Hypertension recheck.  Counseling provided for all of the vaccine components Orders Placed This Encounter  Procedures  . CMP14+EGFR  . Lipid panel  . PSA, total and free    Caryl Pina, MD [facility] 04/13/2016, 4:37 PM

## 2016-04-17 ENCOUNTER — Other Ambulatory Visit: Payer: Self-pay | Admitting: Family Medicine

## 2016-07-18 ENCOUNTER — Ambulatory Visit (INDEPENDENT_AMBULATORY_CARE_PROVIDER_SITE_OTHER): Payer: BLUE CROSS/BLUE SHIELD | Admitting: Family Medicine

## 2016-07-18 ENCOUNTER — Encounter: Payer: Self-pay | Admitting: Family Medicine

## 2016-07-18 VITALS — BP 123/81 | HR 67 | Temp 97.7°F | Ht 71.0 in | Wt 305.0 lb

## 2016-07-18 DIAGNOSIS — I10 Essential (primary) hypertension: Secondary | ICD-10-CM

## 2016-07-18 DIAGNOSIS — E785 Hyperlipidemia, unspecified: Secondary | ICD-10-CM

## 2016-07-18 DIAGNOSIS — R7989 Other specified abnormal findings of blood chemistry: Secondary | ICD-10-CM

## 2016-07-18 MED ORDER — ATORVASTATIN CALCIUM 40 MG PO TABS: 40.0000 mg | ORAL_TABLET | Freq: Every day | ORAL | 3 refills | Status: DC

## 2016-07-18 MED ORDER — HYDROCHLOROTHIAZIDE 25 MG PO TABS: 25.0000 mg | ORAL_TABLET | Freq: Every day | ORAL | 3 refills | Status: DC

## 2016-07-18 MED ORDER — LISINOPRIL 10 MG PO TABS: 10.0000 mg | ORAL_TABLET | Freq: Every day | ORAL | 3 refills | Status: DC

## 2016-07-18 NOTE — Progress Notes (Signed)
BP 123/81   Pulse 67   Temp 97.7 F (36.5 C) (Oral)   Ht 5' 11"  (1.803 m)   Wt (!) 305 lb (138.3 kg)   BMI 42.54 kg/m    Subjective:    Patient ID: Jeffrey Stark, male    DOB: 04/18/1966, 51 y.o.   MRN: [id number]  HPI: Jeffrey Stark is a 51 y.o. male presenting on 07/18/2016 for cpe   HPI Hypertension Patient came in for a hypertension recheck today. His blood pressure today is 123/81. He is currently almost in a pro-1 hydrochlorothiazide. Patient denies headaches, blurred vision, chest pains, shortness of breath, or weakness. Denies any side effects from medication and is content with current medication.   Hyperlipidemia Patient is coming in for cholesterol recheck. He is currently on Lipitor. He denies any issues such as myalgias with the medication. Has not been taking it as consistently as he should recently. He denies any focal numbness or weakness.  Low vitamin D levels Patient has been having issues with low vitamin D levels but he has not been seen for this for some time at least 6 months he has not been tested. He was taken vitamin D but is not currently taking it and we will test to see where he is at. He denies any fatigue or arthralgias.  Relevant past medical, surgical, family and social history reviewed and updated as indicated. Interim medical history since our last visit reviewed. Allergies and medications reviewed and updated.  Review of Systems  Constitutional: Negative for chills and fever.  Eyes: Negative for discharge.  Respiratory: Negative for shortness of breath and wheezing.   Cardiovascular: Negative for chest pain and leg swelling.  Musculoskeletal: Negative for back pain and gait problem.  Skin: Negative for rash.  Neurological: Negative for dizziness, tremors, facial asymmetry, weakness, light-headedness, numbness and headaches.  All other systems reviewed and are negative.   Per HPI unless specifically indicated above   Allergies as of  07/18/2016   No Known Allergies     Medication List       Accurate as of 07/18/16  4:42 PM. Always use your most recent med list.          atorvastatin 40 MG tablet Commonly known as:  LIPITOR Take 1 tablet (40 mg total) by mouth daily. For cholesterol   hydrochlorothiazide 25 MG tablet Commonly known as:  HYDRODIURIL Take 1 tablet (25 mg total) by mouth daily.   lisinopril 10 MG tablet Commonly known as:  PRINIVIL,ZESTRIL Take 1 tablet (10 mg total) by mouth daily.          Objective:    BP 123/81   Pulse 67   Temp 97.7 F (36.5 C) (Oral)   Ht 5' 11"  (1.803 m)   Wt (!) 305 lb (138.3 kg)   BMI 42.54 kg/m   Wt Readings from Last 3 Encounters:  07/18/16 (!) 305 lb (138.3 kg)  04/13/16 299 lb 8 oz (135.9 kg)  09/17/15 (!) 310 lb (140.6 kg)    Physical Exam  Constitutional: He is oriented to person, place, and time. He appears well-developed and well-nourished. No distress.  Eyes: Conjunctivae are normal. Right eye exhibits no discharge. Left eye exhibits no discharge. No scleral icterus.  Neck: Neck supple. No thyromegaly present.  Cardiovascular: Normal rate, regular rhythm, normal heart sounds and intact distal pulses.   No murmur heard. Pulmonary/Chest: Effort normal and breath sounds normal. No respiratory distress. He has no wheezes. He has no rales.  Musculoskeletal: Normal range of motion. He exhibits no edema.  Lymphadenopathy:    He has no cervical adenopathy.  Neurological: He is alert and oriented to person, place, and time. Coordination normal.  Skin: Skin is warm and dry. No rash noted. He is not diaphoretic.  Psychiatric: He has a normal mood and affect. His behavior is normal. Judgment and thought content normal.  Nursing note and vitals reviewed.     Assessment & Plan:   Problem List Items Addressed This Visit      Cardiovascular and Mediastinum   Hypertension - Primary   Relevant Medications   lisinopril (PRINIVIL,ZESTRIL) 10 MG tablet    hydrochlorothiazide (HYDRODIURIL) 25 MG tablet   atorvastatin (LIPITOR) 40 MG tablet   Other Relevant Orders   CMP14+EGFR (Completed)     Other   Hyperlipidemia LDL goal <130   Relevant Medications   lisinopril (PRINIVIL,ZESTRIL) 10 MG tablet   hydrochlorothiazide (HYDRODIURIL) 25 MG tablet   atorvastatin (LIPITOR) 40 MG tablet   Other Relevant Orders   Lipid panel (Completed)   Low serum vitamin D   Relevant Orders   VITAMIN D 25 Hydroxy (Vit-D Deficiency, Fractures) (Completed)       Follow up plan: Return in about 6 months (around 01/15/2017), or if symptoms worsen or fail to improve.  Counseling provided for all of the vaccine components Orders Placed This Encounter  Procedures  . CMP14+EGFR  . Lipid panel  . VITAMIN D 25 Hydroxy (Vit-D Deficiency, Fractures)    Caryl Pina, MD [facility] 07/18/2016, 4:42 PM

## 2016-07-19 LAB — CMP14+EGFR
ALBUMIN: 4.4 g/dL (ref 3.5–5.5)
ALT: 19 IU/L (ref 0–44)
AST: 22 IU/L (ref 0–40)
Albumin/Globulin Ratio: 1.5 (ref 1.2–2.2)
Alkaline Phosphatase: 104 IU/L (ref 39–117)
BUN / CREAT RATIO: 14 (ref 9–20)
BUN: 16 mg/dL (ref 6–24)
Bilirubin Total: 0.3 mg/dL (ref 0.0–1.2)
CALCIUM: 10 mg/dL (ref 8.7–10.2)
CO2: 23 mmol/L (ref 18–29)
CREATININE: 1.13 mg/dL (ref 0.76–1.27)
Chloride: 103 mmol/L (ref 96–106)
GFR calc Af Amer: 87 (ref >59)
GFR, EST NON AFRICAN AMERICAN: 75 (ref >59)
GLOBULIN, TOTAL: 2.9 (ref 1.5–4.5)
GLUCOSE: 93 mg/dL (ref 65–99)
Potassium: 3.8 mmol/L (ref 3.5–5.2)
SODIUM: 141 mmol/L (ref 134–144)
Total Protein: 7.3 g/dL (ref 6.0–8.5)

## 2016-07-19 LAB — LIPID PANEL
CHOL/HDL RATIO: 3.6 (ref 0.0–5.0)
CHOLESTEROL TOTAL: 157 mg/dL (ref 100–199)
HDL: 44 mg/dL (ref >39)
LDL CALC: 95 (ref 0–99)
Triglycerides: 90 mg/dL (ref 0–149)
VLDL CHOLESTEROL CAL: 18 (ref 5–40)

## 2016-07-19 LAB — VITAMIN D 25 HYDROXY (VIT D DEFICIENCY, FRACTURES): Vit D, 25-Hydroxy: 17 ng/mL — ABNORMAL LOW (ref 30.0–100.0)

## 2016-08-02 ENCOUNTER — Encounter: Payer: Self-pay | Admitting: *Deleted

## 2016-08-09 NOTE — Telephone Encounter (Signed)
Attempted call. No answer.

## 2017-11-04 ENCOUNTER — Other Ambulatory Visit: Payer: Self-pay | Admitting: Family Medicine

## 2017-11-04 DIAGNOSIS — E785 Hyperlipidemia, unspecified: Secondary | ICD-10-CM

## 2017-12-01 ENCOUNTER — Other Ambulatory Visit: Payer: Self-pay | Admitting: Family Medicine

## 2017-12-01 DIAGNOSIS — I10 Essential (primary) hypertension: Secondary | ICD-10-CM

## 2017-12-01 DIAGNOSIS — E785 Hyperlipidemia, unspecified: Secondary | ICD-10-CM

## 2017-12-03 NOTE — Telephone Encounter (Signed)
Last seen 07/18/17  Dr Dettinger   Dr Livia Snellen PCP

## 2017-12-05 ENCOUNTER — Other Ambulatory Visit: Payer: Self-pay | Admitting: Family Medicine

## 2017-12-05 DIAGNOSIS — I10 Essential (primary) hypertension: Secondary | ICD-10-CM

## 2017-12-05 DIAGNOSIS — E785 Hyperlipidemia, unspecified: Secondary | ICD-10-CM

## 2018-12-05 ENCOUNTER — Ambulatory Visit: Payer: Commercial Managed Care - PPO | Admitting: Family Medicine

## 2018-12-05 ENCOUNTER — Encounter: Payer: Self-pay | Admitting: Family Medicine

## 2018-12-05 ENCOUNTER — Encounter: Payer: Self-pay | Admitting: *Deleted

## 2018-12-05 ENCOUNTER — Other Ambulatory Visit: Payer: Self-pay

## 2018-12-05 DIAGNOSIS — K5909 Other constipation: Secondary | ICD-10-CM

## 2018-12-05 NOTE — Progress Notes (Signed)
    Subjective:    Patient ID: Jeffrey Stark, male    DOB: Mar 11, 1966, 53 y.o.   MRN: [id number]   HPI: Jeffrey Stark is a 53 y.o. male presenting for constipation 2 days ago. Had some abd bloating and pain at the time. Resolved with OTC laxative treatment. No fever. Feeling fine now. He had no UR sx. No cough or congestion. Need note to return to work. Depression screen [facility] 2/9 07/18/2016 04/13/2016 09/17/2015 03/18/2015 08/27/2014  Decreased Interest 0 0 0 0 0  Down, Depressed, Hopeless 0 0 0 0 0  PHQ - 2 Score 0 0 0 0 0    Relevant past medical, surgical, family and social history reviewed and updated as indicated.  Interim medical history since our last visit reviewed. Allergies and medications reviewed and updated.  ROS:  Review of Systems  Constitutional: Negative for activity change, fatigue and fever.  HENT: Negative for congestion, ear pain, sneezing and sore throat.   Respiratory: Negative for cough.   Cardiovascular: Negative for chest pain.  Musculoskeletal: Negative for arthralgias.  Skin: Negative for rash.     Social History   Tobacco Use  Smoking Status Former Smoker  Smokeless Tobacco Former Systems developer  . Quit date: 05/29/2002       Objective:     Wt Readings from Last 3 Encounters:  07/18/16 (!) 305 lb (138.3 kg)  04/13/16 299 lb 8 oz (135.9 kg)  09/17/15 (!) 310 lb (140.6 kg)     Exam deferred. Pt. Harboring due to COVID 19. Phone visit performed.   Assessment & Plan:   1. Other constipation     No orders of the defined types were placed in this encounter.   No orders of the defined types were placed in this encounter.     Diagnoses and all orders for this visit:  Other constipation    Virtual Visit via telephone Note  I discussed the limitations, risks, security and privacy concerns of performing an evaluation and management service by telephone and the availability of in person appointments. The patient was identified with two identifiers.  Pt.expressed understanding and agreed to proceed. Pt. Is at home. Dr. Livia Snellen is in his office.  Follow Up Instructions:   I discussed the assessment and treatment plan with the patient. The patient was provided an opportunity to ask questions and all were answered. The patient agreed with the plan and demonstrated an understanding of the instructions.   The patient was advised to call back or seek an in-person evaluation if the symptoms worsen or if the condition fails to improve as anticipated.   Total minutes including chart review and phone contact time: 7   Follow up plan: Return if symptoms worsen or fail to improve.  Claretta Fraise, MD [facility]

## 2018-12-06 ENCOUNTER — Telehealth: Payer: Self-pay | Admitting: Family Medicine

## 2018-12-06 NOTE — Telephone Encounter (Signed)
Appt made

## 2018-12-27 ENCOUNTER — Other Ambulatory Visit: Payer: Self-pay

## 2018-12-30 ENCOUNTER — Encounter: Payer: Self-pay | Admitting: Family Medicine

## 2018-12-30 ENCOUNTER — Ambulatory Visit (INDEPENDENT_AMBULATORY_CARE_PROVIDER_SITE_OTHER): Payer: Commercial Managed Care - PPO | Admitting: Family Medicine

## 2018-12-30 VITALS — BP 190/83 | HR 67 | Temp 98.4°F | Ht 71.0 in | Wt 322.4 lb

## 2018-12-30 DIAGNOSIS — I10 Essential (primary) hypertension: Secondary | ICD-10-CM

## 2018-12-30 DIAGNOSIS — E785 Hyperlipidemia, unspecified: Secondary | ICD-10-CM | POA: Diagnosis not present

## 2018-12-30 MED ORDER — AMLODIPINE BESYLATE 5 MG PO TABS: 5.0000 mg | ORAL_TABLET | Freq: Every day | ORAL | 5 refills | Status: DC

## 2018-12-30 MED ORDER — ATORVASTATIN CALCIUM 10 MG PO TABS: 10.0000 mg | ORAL_TABLET | Freq: Every day | ORAL | 1 refills | Status: DC

## 2018-12-30 NOTE — Progress Notes (Signed)
Subjective:  Patient ID: Jeffrey Stark, male    DOB: [birth year]  Age: 53 y.o. MRN: [id number]  CC: Hypertension (check up of chronic medical conditons - Patient states he has been off of his meds x 2 years due to not having insurance.) and Hyperlipidemia   HPI Jeffrey Stark presents for  follow-up of hypertension. Patient has no history of headache chest pain or shortness of breath or recent cough. Patient also denies symptoms of TIA such as focal numbness or weakness. Patient denies side effects from medication. States taking it regularly. Patient has tolerated statins in the past.  He is trying to get reestablished on his medication for cholesterol.  History Jeffrey Stark has a past medical history of DVT (deep venous thrombosis) ([place]), Gout, and Hypertension.   He has a past surgical history that includes Colon surgery.   His family history includes Cancer in his father; Leukemia in his mother.He reports that he has quit smoking. He quit smokeless tobacco use about 16 years ago. He reports current alcohol use. He reports that he does not use drugs.  No current outpatient medications on file prior to visit.   No current facility-administered medications on file prior to visit.     ROS Review of Systems  Constitutional: Negative.   HENT: Negative.   Eyes: Negative for visual disturbance.  Respiratory: Negative for cough and shortness of breath.   Cardiovascular: Negative for chest pain and leg swelling.  Gastrointestinal: Negative for abdominal pain, diarrhea, nausea and vomiting.  Genitourinary: Negative for difficulty urinating.  Musculoskeletal: Negative for arthralgias and myalgias.  Skin: Negative for rash.  Neurological: Negative for headaches.  Psychiatric/Behavioral: Negative for sleep disturbance.    Objective:  BP (!) 190/83   Pulse 67   Temp 98.4 F (36.9 C) (Temporal)   Ht _0  (1.803 m)   Wt (!) 322 lb 6.4 oz (146.2 kg)   BMI 44.97 kg/m   BP Readings from Last  3 Encounters:  12/30/18 (!) 190/83  07/18/16 123/81  04/13/16 124/76    Wt Readings from Last 3 Encounters:  12/30/18 (!) 322 lb 6.4 oz (146.2 kg)  07/18/16 (!) 305 lb (138.3 kg)  04/13/16 299 lb 8 oz (135.9 kg)     Physical Exam Constitutional:      General: He is not in acute distress.    Appearance: He is well-developed.  HENT:     Head: Normocephalic and atraumatic.     Right Ear: External ear normal.     Left Ear: External ear normal.     Nose: Nose normal.  Eyes:     Conjunctiva/sclera: Conjunctivae normal.     Pupils: Pupils are equal, round, and reactive to light.  Neck:     Musculoskeletal: Normal range of motion and neck supple.  Cardiovascular:     Rate and Rhythm: Normal rate and regular rhythm.     Heart sounds: Normal heart sounds. No murmur.  Pulmonary:     Effort: Pulmonary effort is normal. No respiratory distress.     Breath sounds: Normal breath sounds. No wheezing or rales.  Abdominal:     Palpations: Abdomen is soft.     Tenderness: There is no abdominal tenderness.  Musculoskeletal: Normal range of motion.  Skin:    General: Skin is warm and dry.  Neurological:     Mental Status: He is alert and oriented to person, place, and time.     Deep Tendon Reflexes: Reflexes are normal and symmetric.  Psychiatric:  Behavior: Behavior normal.        Thought Content: Thought content normal.        Judgment: Judgment normal.    Results for orders placed or performed in visit on 12/30/18  CBC with Differential/Platelet  Result Value Ref Range   WBC 6.9 3.4 - 10.8 x10E3/uL   RBC 4.68 4.14 - 5.80 x10E6/uL   Hemoglobin 13.1 13.0 - 17.7 g/dL   Hematocrit 38.2 37.5 - 51.0 %   MCV 82 79 - 97 fL   MCH 28.0 26.6 - 33.0 pg   MCHC 34.3 31.5 - 35.7 g/dL   RDW 14.3 11.6 - 15.4 %   Platelets 242 150 - 450 x10E3/uL   Neutrophils 63 Not Estab. %   Lymphs 26 Not Estab. %   Monocytes 10 Not Estab. %   Eos 1 Not Estab. %   Basos 0 Not Estab. %    Neutrophils Absolute 4.3 1.4 - 7.0 x10E3/uL   Lymphocytes Absolute 1.8 0.7 - 3.1 x10E3/uL   Monocytes Absolute 0.7 0.1 - 0.9 x10E3/uL   EOS (ABSOLUTE) 0.1 0.0 - 0.4 x10E3/uL   Basophils Absolute 0.0 0.0 - 0.2 x10E3/uL   Immature Granulocytes 0 Not Estab. %   Immature Grans (Abs) 0.0 0.0 - 0.1 x10E3/uL  CMP14+EGFR  Result Value Ref Range   Glucose 119 (H) 65 - 99 mg/dL   BUN 13 6 - 24 mg/dL   Creatinine, Ser 1.18 0.76 - 1.27 mg/dL   GFR calc non Af Amer 70 >59 mL/min/1.73   GFR calc Af Amer 80 >59 mL/min/1.73   BUN/Creatinine Ratio 11 9 - 20   Sodium 142 134 - 144 mmol/L   Potassium 3.8 3.5 - 5.2 mmol/L   Chloride 102 96 - 106 mmol/L   CO2 22 20 - 29 mmol/L   Calcium 9.2 8.7 - 10.2 mg/dL   Total Protein 6.8 6.0 - 8.5 g/dL   Albumin 4.3 3.8 - 4.9 g/dL   Globulin, Total 2.5 1.5 - 4.5 g/dL   Albumin/Globulin Ratio 1.7 1.2 - 2.2   Bilirubin Total 0.3 0.0 - 1.2 mg/dL   Alkaline Phosphatase 125 (H) 39 - 117 IU/L   AST 17 0 - 40 IU/L   ALT 14 0 - 44 IU/L  Lipid panel  Result Value Ref Range   Cholesterol, Total 217 (H) 100 - 199 mg/dL   Triglycerides 95 0 - 149 mg/dL   HDL 52 >39 mg/dL   VLDL Cholesterol Cal 19 5 - 40 mg/dL   LDL Calculated 146 (H) 0 - 99 mg/dL   Chol/HDL Ratio 4.2 0.0 - 5.0 ratio      Assessment & Plan:   Jeffrey Stark was seen today for hypertension and hyperlipidemia.  Diagnoses and all orders for this visit:  Essential hypertension -     CBC with Differential/Platelet -     CMP14+EGFR -     Lipid panel  Hyperlipidemia LDL goal <130  Other orders -     atorvastatin (LIPITOR) 10 MG tablet; Take 1 tablet (10 mg total) by mouth daily. For cholesterol -     amLODipine (NORVASC) 5 MG tablet; Take 1 tablet (5 mg total) by mouth daily. For blood pressure   Allergies as of 12/30/2018   No Known Allergies     Medication List       Accurate as of December 30, 2018 11:59 PM. If you have any questions, ask your nurse or doctor.        STOP taking these  medications   hydrochlorothiazide 25 MG tablet Commonly known as: HYDRODIURIL Stopped by: Claretta Fraise, MD   lisinopril 10 MG tablet Commonly known as: ZESTRIL Stopped by: Claretta Fraise, MD     TAKE these medications   amLODipine 5 MG tablet Commonly known as: NORVASC Take 1 tablet (5 mg total) by mouth daily. For blood pressure Started by: Claretta Fraise, MD   atorvastatin 10 MG tablet Commonly known as: LIPITOR Take 1 tablet (10 mg total) by mouth daily. For cholesterol What changed:   medication strength  See the new instructions. Changed by: Claretta Fraise, MD       Meds ordered this encounter  Medications  . atorvastatin (LIPITOR) 10 MG tablet    Sig: Take 1 tablet (10 mg total) by mouth daily. For cholesterol    Dispense:  90 tablet    Refill:  1  . amLODipine (NORVASC) 5 MG tablet    Sig: Take 1 tablet (5 mg total) by mouth daily. For blood pressure    Dispense:  30 tablet    Refill:  5      Follow-up: Return in about 3 months (around 04/01/2019).  Claretta Fraise, M.D.

## 2018-12-31 LAB — CMP14+EGFR
ALT: 14 IU/L (ref 0–44)
AST: 17 IU/L (ref 0–40)
Albumin/Globulin Ratio: 1.7 (ref 1.2–2.2)
Albumin: 4.3 g/dL (ref 3.8–4.9)
Alkaline Phosphatase: 125 IU/L — ABNORMAL HIGH (ref 39–117)
BUN/Creatinine Ratio: 11 (ref 9–20)
BUN: 13 mg/dL (ref 6–24)
Bilirubin Total: 0.3 mg/dL (ref 0.0–1.2)
CO2: 22 mmol/L (ref 20–29)
Calcium: 9.2 mg/dL (ref 8.7–10.2)
Chloride: 102 mmol/L (ref 96–106)
Creatinine, Ser: 1.18 mg/dL (ref 0.76–1.27)
GFR calc Af Amer: 80 mL/min/{1.73_m2} (ref >59)
GFR calc non Af Amer: 70 mL/min/{1.73_m2} (ref >59)
Globulin, Total: 2.5 g/dL (ref 1.5–4.5)
Glucose: 119 mg/dL — ABNORMAL HIGH (ref 65–99)
Potassium: 3.8 mmol/L (ref 3.5–5.2)
Sodium: 142 mmol/L (ref 134–144)
Total Protein: 6.8 g/dL (ref 6.0–8.5)

## 2018-12-31 LAB — CBC WITH DIFFERENTIAL/PLATELET
Basophils Absolute: 0 10*3/uL (ref 0.0–0.2)
Basos: 0 %
EOS (ABSOLUTE): 0.1 10*3/uL (ref 0.0–0.4)
Eos: 1 %
Hematocrit: 38.2 % (ref 37.5–51.0)
Hemoglobin: 13.1 g/dL (ref 13.0–17.7)
Immature Grans (Abs): 0 10*3/uL (ref 0.0–0.1)
Immature Granulocytes: 0 %
Lymphocytes Absolute: 1.8 10*3/uL (ref 0.7–3.1)
Lymphs: 26 %
MCH: 28 pg (ref 26.6–33.0)
MCHC: 34.3 g/dL (ref 31.5–35.7)
MCV: 82 fL (ref 79–97)
Monocytes Absolute: 0.7 10*3/uL (ref 0.1–0.9)
Monocytes: 10 %
Neutrophils Absolute: 4.3 10*3/uL (ref 1.4–7.0)
Neutrophils: 63 %
Platelets: 242 10*3/uL (ref 150–450)
RBC: 4.68 x10E6/uL (ref 4.14–5.80)
RDW: 14.3 % (ref 11.6–15.4)
WBC: 6.9 10*3/uL (ref 3.4–10.8)

## 2018-12-31 LAB — LIPID PANEL
Chol/HDL Ratio: 4.2 ratio (ref 0.0–5.0)
Cholesterol, Total: 217 mg/dL — ABNORMAL HIGH (ref 100–199)
HDL: 52 mg/dL (ref >39)
LDL Calculated: 146 mg/dL — ABNORMAL HIGH (ref 0–99)
Triglycerides: 95 mg/dL (ref 0–149)
VLDL Cholesterol Cal: 19 mg/dL (ref 5–40)

## 2019-01-07 ENCOUNTER — Encounter: Payer: Self-pay | Admitting: Family Medicine

## 2019-01-09 ENCOUNTER — Encounter: Payer: Self-pay | Admitting: *Deleted

## 2019-02-04 ENCOUNTER — Ambulatory Visit (INDEPENDENT_AMBULATORY_CARE_PROVIDER_SITE_OTHER): Payer: Commercial Managed Care - PPO

## 2019-02-04 ENCOUNTER — Other Ambulatory Visit: Payer: Self-pay

## 2019-02-04 ENCOUNTER — Encounter: Payer: Self-pay | Admitting: Family Medicine

## 2019-02-04 ENCOUNTER — Ambulatory Visit: Payer: Commercial Managed Care - PPO | Admitting: Family Medicine

## 2019-02-04 VITALS — BP 146/75 | HR 74 | Temp 99.1°F | Resp 16 | Ht 71.0 in | Wt 317.8 lb

## 2019-02-04 DIAGNOSIS — IMO0002 Reserved for concepts with insufficient information to code with codable children: Secondary | ICD-10-CM

## 2019-02-04 DIAGNOSIS — E785 Hyperlipidemia, unspecified: Secondary | ICD-10-CM | POA: Diagnosis not present

## 2019-02-04 DIAGNOSIS — R2231 Localized swelling, mass and lump, right upper limb: Secondary | ICD-10-CM

## 2019-02-04 DIAGNOSIS — I10 Essential (primary) hypertension: Secondary | ICD-10-CM

## 2019-02-04 DIAGNOSIS — Z1211 Encounter for screening for malignant neoplasm of colon: Secondary | ICD-10-CM | POA: Diagnosis not present

## 2019-02-04 DIAGNOSIS — M21821 Other specified acquired deformities of right upper arm: Secondary | ICD-10-CM | POA: Diagnosis not present

## 2019-02-04 DIAGNOSIS — S42291A Other displaced fracture of upper end of right humerus, initial encounter for closed fracture: Secondary | ICD-10-CM

## 2019-02-04 MED ORDER — ATORVASTATIN CALCIUM 10 MG PO TABS: 10.0000 mg | ORAL_TABLET | Freq: Every day | ORAL | 1 refills | Status: DC

## 2019-02-04 MED ORDER — AMLODIPINE BESYLATE 5 MG PO TABS: 5.0000 mg | ORAL_TABLET | Freq: Every day | ORAL | 5 refills | Status: DC

## 2019-02-04 NOTE — Patient Instructions (Signed)
BP goal <135/85

## 2019-02-05 ENCOUNTER — Encounter: Payer: Self-pay | Admitting: Family Medicine

## 2019-02-05 DIAGNOSIS — S42291A Other displaced fracture of upper end of right humerus, initial encounter for closed fracture: Secondary | ICD-10-CM | POA: Insufficient documentation

## 2019-02-05 DIAGNOSIS — M21821 Other specified acquired deformities of right upper arm: Secondary | ICD-10-CM | POA: Insufficient documentation

## 2019-02-05 LAB — CMP14+EGFR
ALT: 19 IU/L (ref 0–44)
AST: 22 IU/L (ref 0–40)
Albumin/Globulin Ratio: 1.8 (ref 1.2–2.2)
Albumin: 4.3 g/dL (ref 3.8–4.9)
Alkaline Phosphatase: 128 IU/L — ABNORMAL HIGH (ref 39–117)
BUN/Creatinine Ratio: 15 (ref 9–20)
BUN: 16 mg/dL (ref 6–24)
Bilirubin Total: <0.2 mg/dL (ref 0.0–1.2)
CO2: 23 mmol/L (ref 20–29)
Calcium: 9.1 mg/dL (ref 8.7–10.2)
Chloride: 105 mmol/L (ref 96–106)
Creatinine, Ser: 1.1 mg/dL (ref 0.76–1.27)
GFR calc Af Amer: 88 mL/min/{1.73_m2} (ref >59)
GFR calc non Af Amer: 76 mL/min/{1.73_m2} (ref >59)
Globulin, Total: 2.4 g/dL (ref 1.5–4.5)
Glucose: 90 mg/dL (ref 65–99)
Potassium: 4 mmol/L (ref 3.5–5.2)
Sodium: 144 mmol/L (ref 134–144)
Total Protein: 6.7 g/dL (ref 6.0–8.5)

## 2019-02-05 LAB — LIPID PANEL
Chol/HDL Ratio: 3.7 ratio (ref 0.0–5.0)
Cholesterol, Total: 197 mg/dL (ref 100–199)
HDL: 53 mg/dL (ref >39)
LDL Chol Calc (NIH): 123 mg/dL — ABNORMAL HIGH (ref 0–99)
Triglycerides: 116 mg/dL (ref 0–149)
VLDL Cholesterol Cal: 21 mg/dL (ref 5–40)

## 2019-02-05 LAB — URIC ACID: Uric Acid: 8.5 mg/dL (ref 3.7–8.6)
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
## 2019-02-07 ENCOUNTER — Encounter: Payer: Self-pay | Admitting: Internal Medicine

## 2019-02-21 ENCOUNTER — Ambulatory Visit: Payer: Self-pay | Admitting: Gastroenterology

## 2019-03-12 ENCOUNTER — Ambulatory Visit: Payer: Self-pay | Admitting: Gastroenterology

## 2019-03-31 ENCOUNTER — Encounter: Payer: Self-pay | Admitting: Internal Medicine

## 2019-03-31 ENCOUNTER — Telehealth: Payer: Self-pay | Admitting: Internal Medicine

## 2019-03-31 ENCOUNTER — Encounter: Payer: Self-pay | Admitting: Gastroenterology

## 2019-03-31 ENCOUNTER — Ambulatory Visit: Payer: Self-pay | Admitting: Gastroenterology

## 2019-03-31 NOTE — Progress Notes (Deleted)
Primary Care Physician:  Claretta Fraise, MD  Primary Gastroenterologist:  Garfield Cornea, MD   No chief complaint on file.   HPI:  Jeffrey Stark is a 53 y.o. male here to schedule colonoscopy.  Current Outpatient Medications  Medication Sig Dispense Refill  . amLODipine (NORVASC) 5 MG tablet Take 1 tablet (5 mg total) by mouth daily. For blood pressure 30 tablet 5  . atorvastatin (LIPITOR) 10 MG tablet Take 1 tablet (10 mg total) by mouth daily. For cholesterol 90 tablet 1   No current facility-administered medications for this visit.     Allergies as of 03/31/2019  . (No Known Allergies)    Past Medical History:  Diagnosis Date  . DVT (deep venous thrombosis) ([place])   . Gout   . Hyperlipidemia   . Hypertension     Past Surgical History:  Procedure Laterality Date  . COLON SURGERY     colon polyps    Family History  Problem Relation Age of Onset  . Leukemia Mother   . Cancer Father     Social History   Socioeconomic History  . Marital status: Married    Spouse name: Not on file  . Number of children: Not on file  . Years of education: Not on file  . Highest education level: Not on file  Occupational History  . Not on file  Social Needs  . Financial resource strain: Not on file  . Food insecurity    Worry: Not on file    Inability: Not on file  . Transportation needs    Medical: Not on file    Non-medical: Not on file  Tobacco Use  . Smoking status: Former Research scientist (life sciences)  . Smokeless tobacco: Former Systems developer    Quit date: 05/29/2002  Substance and Sexual Activity  . Alcohol use: Yes    Comment: occasional  . Drug use: No  . Sexual activity: Not on file  Lifestyle  . Physical activity    Days per week: Not on file    Minutes per session: Not on file  . Stress: Not on file  Relationships  . Social Herbalist on phone: Not on file    Gets together: Not on file    Attends religious service: Not on file    Active member of club or organization: Not on  file    Attends meetings of clubs or organizations: Not on file    Relationship status: Not on file  . Intimate partner violence    Fear of current or ex partner: Not on file    Emotionally abused: Not on file    Physically abused: Not on file    Forced sexual activity: Not on file  Other Topics Concern  . Not on file  Social History Narrative  . Not on file      ROS:  General: Negative for anorexia, weight loss, fever, chills, fatigue, weakness. Eyes: Negative for vision changes.  ENT: Negative for hoarseness, difficulty swallowing , nasal congestion. CV: Negative for chest pain, angina, palpitations, dyspnea on exertion, peripheral edema.  Respiratory: Negative for dyspnea at rest, dyspnea on exertion, cough, sputum, wheezing.  GI: See history of present illness. GU:  Negative for dysuria, hematuria, urinary incontinence, urinary frequency, nocturnal urination.  MS: Negative for joint pain, low back pain.  Derm: Negative for rash or itching.  Neuro: Negative for weakness, abnormal sensation, seizure, frequent headaches, memory loss, confusion.  Psych: Negative for anxiety, depression, suicidal ideation, hallucinations.  Endo:  Negative for unusual weight change.  Heme: Negative for bruising or bleeding. Allergy: Negative for rash or hives.    Physical Examination:  There were no vitals taken for this visit.   General: Well-nourished, well-developed in no acute distress.  Head: Normocephalic, atraumatic.   Eyes: Conjunctiva pink, no icterus. Mouth: Oropharyngeal mucosa moist and pink , no lesions erythema or exudate. Neck: Supple without thyromegaly, masses, or lymphadenopathy.  Lungs: Clear to auscultation bilaterally.  Heart: Regular rate and rhythm, no murmurs rubs or gallops.  Abdomen: Bowel sounds are normal, nontender, nondistended, no hepatosplenomegaly or masses, no abdominal bruits or    hernia , no rebound or guarding.   Rectal: *** Extremities: No lower  extremity edema. No clubbing or deformities.  Neuro: Alert and oriented x 4 , grossly normal neurologically.  Skin: Warm and dry, no rash or jaundice.   Psych: Alert and cooperative, normal mood and affect.  Labs: Lab Results  Component Value Date   CREATININE 1.10 02/04/2019   BUN 16 02/04/2019   NA 144 02/04/2019   K 4.0 02/04/2019   CL 105 02/04/2019   CO2 23 02/04/2019   Lab Results  Component Value Date   ALT 19 02/04/2019   AST 22 02/04/2019   ALKPHOS 128 (H) 02/04/2019   BILITOT <0.2 02/04/2019   Lab Results  Component Value Date   WBC 6.9 12/30/2018   HGB 13.1 12/30/2018   HCT 38.2 12/30/2018   MCV 82 12/30/2018   PLT 242 12/30/2018     Imaging Studies: No results found.

## 2019-03-31 NOTE — Telephone Encounter (Signed)
PATIENT WAS A NO SHOW AND LETTER SENT  °

## 2019-04-18 ENCOUNTER — Other Ambulatory Visit: Payer: Self-pay

## 2019-04-18 ENCOUNTER — Ambulatory Visit (INDEPENDENT_AMBULATORY_CARE_PROVIDER_SITE_OTHER): Payer: Commercial Managed Care - PPO | Admitting: Gastroenterology

## 2019-04-18 ENCOUNTER — Encounter: Payer: Self-pay | Admitting: Gastroenterology

## 2019-04-18 DIAGNOSIS — Z1211 Encounter for screening for malignant neoplasm of colon: Secondary | ICD-10-CM

## 2019-04-18 MED ORDER — PEG 3350-KCL-NA BICARB-NACL 420 G PO SOLR: 4000.0000 mL | ORAL | 0 refills | Status: DC

## 2019-04-18 NOTE — Assessment & Plan Note (Signed)
Pleasant 53 year old gentleman presenting to schedule colonoscopy.  He had a colonoscopy in his 55s for rectal bleeding, states he had polyps.  Details not available.  Plan for colonoscopy in the near future.  Given morbid obesity, plan for assistance with anesthesia.  I have discussed the risks, alternatives, benefits with regards to but not limited to the risk of reaction to medication, bleeding, infection, perforation and the patient is agreeable to proceed. Written consent to be obtained.

## 2019-04-18 NOTE — Progress Notes (Signed)
cc'ed to pcp °

## 2019-04-18 NOTE — Progress Notes (Signed)
Primary Care Physician:  Claretta Fraise, MD  Primary Gastroenterologist:  Garfield Cornea, MD   Chief Complaint  Patient presents with  . Colonoscopy    HPI:  Jeffrey Stark is a 53 y.o. male here at the request of Dr. Livia Snellen to schedule screening colonoscopy.  Due to morbid obesity he is welcome to discuss sedation needs.  He denies any bowel concerns.  No constipation or diarrhea.  No melena or rectal bleeding.  Denies abdominal pain.  No upper GI symptoms.  He reports having a colonoscopy in his 77s due to rectal bleeding.  He states he had several polyps removed.  Further details not available.  He has had 3 "cyst" removed from the perineal area throughout the years.  One site never healed completely.  States he has some bleeding intermittently.  Surgeon told him he may need to have further surgery but he did not pursue this.  Current Outpatient Medications  Medication Sig Dispense Refill  . amLODipine (NORVASC) 5 MG tablet Take 1 tablet (5 mg total) by mouth daily. For blood pressure 30 tablet 5  . atorvastatin (LIPITOR) 10 MG tablet Take 1 tablet (10 mg total) by mouth daily. For cholesterol 90 tablet 1  . ibuprofen (ADVIL) 200 MG tablet Take 200 mg by mouth every 6 (six) hours as needed. PT takes 4 tablets as needed ( 800 mg)   ( not very often)     No current facility-administered medications for this visit.     Allergies as of 04/18/2019  . (No Known Allergies)    Past Medical History:  Diagnosis Date  . DVT (deep venous thrombosis) ([place]) 2010   developed DVT while in hospital waiting for cyst surgery  . Gout   . Hyperlipidemia   . Hypertension   . Pulmonary emboli ([place]) 2010    Past Surgical History:  Procedure Laterality Date  . COLONOSCOPY     in his 23s, had polyps removed  . CYST EXCISION     X3    Family History  Problem Relation Age of Onset  . Leukemia Mother   . Cancer Father        unknown primary  . Colon cancer Neg Hx     Social History    Socioeconomic History  . Marital status: Married    Spouse name: Not on file  . Number of children: Not on file  . Years of education: Not on file  . Highest education level: Not on file  Occupational History  . Not on file  Social Needs  . Financial resource strain: Not on file  . Food insecurity    Worry: Not on file    Inability: Not on file  . Transportation needs    Medical: Not on file    Non-medical: Not on file  Tobacco Use  . Smoking status: Former Research scientist (life sciences)  . Smokeless tobacco: Former Systems developer    Quit date: 05/29/2002  Substance and Sexual Activity  . Alcohol use: Yes    Comment: occasional  . Drug use: No  . Sexual activity: Not on file  Lifestyle  . Physical activity    Days per week: Not on file    Minutes per session: Not on file  . Stress: Not on file  Relationships  . Social Herbalist on phone: Not on file    Gets together: Not on file    Attends religious service: Not on file    Active member of club or organization:  Not on file    Attends meetings of clubs or organizations: Not on file    Relationship status: Not on file  . Intimate partner violence    Fear of current or ex partner: Not on file    Emotionally abused: Not on file    Physically abused: Not on file    Forced sexual activity: Not on file  Other Topics Concern  . Not on file  Social History Narrative  . Not on file      ROS:  General: Negative for anorexia, weight loss, fever, chills, fatigue, weakness. Eyes: Negative for vision changes.  ENT: Negative for hoarseness, difficulty swallowing , nasal congestion. CV: Negative for chest pain, angina, palpitations, dyspnea on exertion, peripheral edema.  Respiratory: Negative for dyspnea at rest, dyspnea on exertion, cough, sputum, wheezing.  GI: See history of present illness. GU:  Negative for dysuria, hematuria, urinary incontinence, urinary frequency, nocturnal urination.  MS: Negative for joint pain, low back pain.  Derm:  Negative for rash or itching.  Neuro: Negative for weakness, abnormal sensation, seizure, frequent headaches, memory loss, confusion.  Psych: Negative for anxiety, depression, suicidal ideation, hallucinations.  Endo: Negative for unusual weight change.  Heme: Negative for bruising or bleeding. Allergy: Negative for rash or hives.    Physical Examination:  BP (!) 162/97   Pulse 79   Temp (!) 97.1 F (36.2 C) (Temporal)   Ht 5\' 11"  (1.803 m)   Wt (!) 312 lb 3.2 oz (141.6 kg)   BMI 43.54 kg/m    General: Well-nourished, well-developed in no acute distress.  Head: Normocephalic, atraumatic.   Eyes: Conjunctiva pink, no icterus. Mouth: masked Neck: Supple without thyromegaly, masses, or lymphadenopathy.  Lungs: Clear to auscultation bilaterally.  Heart: Regular rate and rhythm, no murmurs rubs or gallops.  Abdomen: Bowel sounds are normal, nontender, nondistended, no hepatosplenomegaly or masses, no abdominal bruits or    hernia , no rebound or guarding.   Rectal: Deferred Extremities: No lower extremity edema. No clubbing or deformities.  Neuro: Alert and oriented x 4 , grossly normal neurologically.  Skin: Warm and dry, no rash or jaundice.   Psych: Alert and cooperative, normal mood and affect.  Labs: Lab Results  Component Value Date   CREATININE 1.10 02/04/2019   BUN 16 02/04/2019   NA 144 02/04/2019   K 4.0 02/04/2019   CL 105 02/04/2019   CO2 23 02/04/2019   Lab Results  Component Value Date   ALT 19 02/04/2019   AST 22 02/04/2019   ALKPHOS 128 (H) 02/04/2019   BILITOT <0.2 02/04/2019   Lab Results  Component Value Date   WBC 6.9 12/30/2018   HGB 13.1 12/30/2018   HCT 38.2 12/30/2018   MCV 82 12/30/2018   PLT 242 12/30/2018     Imaging Studies: No results found.

## 2019-04-18 NOTE — Patient Instructions (Signed)
1. Colonoscopy as scheduled. Please see separate instructions. 

## 2019-04-21 ENCOUNTER — Other Ambulatory Visit: Payer: Self-pay

## 2019-04-21 NOTE — Patient Instructions (Signed)
No PA needed for TCS per [employer] website. 

## 2019-05-06 ENCOUNTER — Encounter: Payer: Self-pay | Admitting: Family Medicine

## 2019-05-06 ENCOUNTER — Ambulatory Visit: Payer: Commercial Managed Care - PPO | Admitting: Family Medicine

## 2019-07-15 ENCOUNTER — Other Ambulatory Visit (HOSPITAL_COMMUNITY)
Admission: RE | Admit: 2019-07-15 | Discharge: 2019-07-15 | Disposition: A | Discharge status: Home / Self Care | Payer: Commercial Managed Care - PPO | Source: Ambulatory Visit | Attending: Internal Medicine | Admitting: Internal Medicine

## 2019-07-15 ENCOUNTER — Other Ambulatory Visit: Payer: Self-pay

## 2019-07-15 ENCOUNTER — Encounter (HOSPITAL_COMMUNITY)
Admission: RE | Admit: 2019-07-15 | Discharge: 2019-07-15 | Disposition: A | Discharge status: Home / Self Care | Payer: Commercial Managed Care - PPO | Source: Ambulatory Visit | Attending: Internal Medicine | Admitting: Internal Medicine

## 2019-07-15 DIAGNOSIS — Z20822 Contact with and (suspected) exposure to covid-19: Secondary | ICD-10-CM | POA: Insufficient documentation

## 2019-07-15 DIAGNOSIS — Z01812 Encounter for preprocedural laboratory examination: Secondary | ICD-10-CM | POA: Diagnosis present

## 2019-07-16 ENCOUNTER — Encounter: Payer: Self-pay | Admitting: *Deleted

## 2019-07-16 ENCOUNTER — Telehealth: Payer: Self-pay | Admitting: *Deleted

## 2019-07-16 LAB — SARS CORONAVIRUS 2 (TAT 6-24 HRS): SARS Coronavirus 2: NEGATIVE
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
## 2019-08-12 ENCOUNTER — Encounter (HOSPITAL_COMMUNITY)
Admission: RE | Admit: 2019-08-12 | Discharge: 2019-08-12 | Disposition: A | Discharge status: Home / Self Care | Payer: Commercial Managed Care - PPO | Source: Ambulatory Visit | Attending: Internal Medicine | Admitting: Internal Medicine

## 2019-08-12 ENCOUNTER — Other Ambulatory Visit: Payer: Self-pay

## 2019-08-13 ENCOUNTER — Other Ambulatory Visit (HOSPITAL_COMMUNITY)
Admission: RE | Admit: 2019-08-13 | Discharge: 2019-08-13 | Disposition: A | Discharge status: Home / Self Care | Payer: Commercial Managed Care - PPO | Source: Ambulatory Visit | Attending: Internal Medicine | Admitting: Internal Medicine

## 2019-08-13 ENCOUNTER — Other Ambulatory Visit: Payer: Self-pay

## 2019-08-13 DIAGNOSIS — Z20822 Contact with and (suspected) exposure to covid-19: Secondary | ICD-10-CM | POA: Insufficient documentation

## 2019-08-13 DIAGNOSIS — Z01812 Encounter for preprocedural laboratory examination: Secondary | ICD-10-CM | POA: Insufficient documentation

## 2019-08-13 LAB — SARS CORONAVIRUS 2 (TAT 6-24 HRS): SARS Coronavirus 2: NEGATIVE

## 2019-08-15 ENCOUNTER — Ambulatory Visit (HOSPITAL_COMMUNITY): Payer: Commercial Managed Care - PPO | Admitting: Anesthesiology

## 2019-08-15 ENCOUNTER — Encounter (HOSPITAL_COMMUNITY): Payer: Self-pay | Admitting: Internal Medicine

## 2019-08-15 ENCOUNTER — Encounter (HOSPITAL_COMMUNITY)
Admission: RE | Disposition: A | Discharge status: Home / Self Care | Payer: Self-pay | Source: Home / Self Care | Attending: Internal Medicine

## 2019-08-15 ENCOUNTER — Ambulatory Visit (HOSPITAL_COMMUNITY)
Admission: RE | Admit: 2019-08-15 | Discharge: 2019-08-15 | Disposition: A | Discharge status: Home / Self Care | Payer: Commercial Managed Care - PPO | Attending: Internal Medicine | Admitting: Internal Medicine

## 2019-08-15 DIAGNOSIS — E785 Hyperlipidemia, unspecified: Secondary | ICD-10-CM | POA: Insufficient documentation

## 2019-08-15 DIAGNOSIS — Z1211 Encounter for screening for malignant neoplasm of colon: Secondary | ICD-10-CM | POA: Insufficient documentation

## 2019-08-15 DIAGNOSIS — D128 Benign neoplasm of rectum: Secondary | ICD-10-CM | POA: Diagnosis not present

## 2019-08-15 DIAGNOSIS — I10 Essential (primary) hypertension: Secondary | ICD-10-CM | POA: Diagnosis not present

## 2019-08-15 DIAGNOSIS — K5730 Diverticulosis of large intestine without perforation or abscess without bleeding: Secondary | ICD-10-CM | POA: Insufficient documentation

## 2019-08-15 DIAGNOSIS — Z87891 Personal history of nicotine dependence: Secondary | ICD-10-CM | POA: Diagnosis not present

## 2019-08-15 DIAGNOSIS — Z79899 Other long term (current) drug therapy: Secondary | ICD-10-CM | POA: Insufficient documentation

## 2019-08-15 DIAGNOSIS — Z86010 Personal history of colonic polyps: Secondary | ICD-10-CM | POA: Insufficient documentation

## 2019-08-15 DIAGNOSIS — Z86718 Personal history of other venous thrombosis and embolism: Secondary | ICD-10-CM | POA: Insufficient documentation

## 2019-08-15 DIAGNOSIS — Z86711 Personal history of pulmonary embolism: Secondary | ICD-10-CM | POA: Diagnosis not present

## 2019-08-15 DIAGNOSIS — K621 Rectal polyp: Secondary | ICD-10-CM

## 2019-08-15 HISTORY — PX: COLONOSCOPY WITH PROPOFOL: SHX5780

## 2019-08-15 HISTORY — PX: POLYPECTOMY: SHX5525

## 2019-08-15 SURGERY — COLONOSCOPY WITH PROPOFOL
Anesthesia: General

## 2019-08-15 MED ORDER — KETAMINE HCL 50 MG/5ML IJ SOSY
PREFILLED_SYRINGE | INTRAMUSCULAR | Status: AC
  Filled 2019-08-15: qty 5

## 2019-08-15 MED ORDER — KETAMINE HCL 10 MG/ML IJ SOLN
INTRAMUSCULAR | Status: DC | PRN
  Administered 2019-08-15: 20 mg via INTRAVENOUS

## 2019-08-15 MED ORDER — PROPOFOL 500 MG/50ML IV EMUL
INTRAVENOUS | Status: DC | PRN
  Administered 2019-08-15: 150 ug/kg/min via INTRAVENOUS

## 2019-08-15 MED ORDER — GLYCOPYRROLATE 0.2 MG/ML IJ SOLN
INTRAMUSCULAR | Status: DC | PRN
  Administered 2019-08-15 (×2): .1 mg via INTRAVENOUS

## 2019-08-15 MED ORDER — PROPOFOL 10 MG/ML IV BOLUS
INTRAVENOUS | Status: DC | PRN
  Administered 2019-08-15 (×3): 10 mg via INTRAVENOUS

## 2019-08-15 MED ORDER — LIDOCAINE HCL (CARDIAC) PF 100 MG/5ML IV SOSY
PREFILLED_SYRINGE | INTRAVENOUS | Status: DC | PRN
  Administered 2019-08-15: 100 mg via INTRAVENOUS

## 2019-08-15 NOTE — Progress Notes (Signed)
   08/15/19 0955  OBSTRUCTIVE SLEEP APNEA  Have you ever been diagnosed with sleep apnea through a sleep study? No  Do you snore loudly (loud enough to be heard through closed doors)?  0  Do you often feel tired, fatigued, or sleepy during the daytime (such as falling asleep during driving or talking to someone)? 0  Has anyone observed you stop breathing during your sleep? 1  Do you have, or are you being treated for high blood pressure? 1  BMI more than 35 kg/m2? 1  Age > 50 (1-yes) 1  Neck circumference greater than:Male 16 inches or larger, Male 17inches or larger? 0  Male Gender (Yes=1) 1  Obstructive Sleep Apnea Score 5  Score 5 or greater  Results sent to PCP

## 2019-08-15 NOTE — Transfer of Care (Signed)
Immediate Anesthesia Transfer of Care Note  Patient: Jeffrey Stark  Procedure(s) Performed: COLONOSCOPY WITH PROPOFOL (N/A ) POLYPECTOMY  Patient Location: PACU  Anesthesia Type:MAC and General  Level of Consciousness: awake, alert  and patient cooperative  Airway & Oxygen Therapy: Patient Spontanous Breathing  Post-op Assessment: Report given to RN and Post -op Vital signs reviewed and stable  Post vital signs: Reviewed and stable  Last Vitals:  Vitals Value Taken Time  BP    Temp    Pulse 78 08/15/19 1205  Resp 26 08/15/19 1205  SpO2 95 % 08/15/19 1205  Vitals shown include unvalidated device data.  Last Pain:  Vitals:   08/15/19 1133  TempSrc:   PainSc: 0-No pain      Patients Stated Pain Goal: 6 (51/70/01 [id number])  Complications: No apparent anesthesia complications

## 2019-08-15 NOTE — H&P (Signed)
@LOGO @   Primary Care Physician:  Claretta Fraise, MD Primary Gastroenterologist:  Dr. Gala Romney  Pre-Procedure History & Physical: HPI:  Jeffrey Stark is a 54 y.o. male is here for a screening colonoscopy.  No bowel symptoms.  Distant history of polyps reportedly removed in his 15s.  No symptoms currently.  Past Medical History:  Diagnosis Date  . DVT (deep venous thrombosis) ([facility]) 2010   developed DVT while in hospital waiting for cyst surgery  . Gout   . Hyperlipidemia   . Hypertension   . Pulmonary emboli ([place]) 2010    Past Surgical History:  Procedure Laterality Date  . COLONOSCOPY     in his 73s, had polyps removed  . CYST EXCISION     X3    Prior to Admission medications   Medication Sig Start Date End Date Taking? Authorizing Provider  amLODipine (NORVASC) 5 MG tablet Take 1 tablet (5 mg total) by mouth daily. For blood pressure 02/04/19  Yes Stacks, Cletus Gash, MD  atorvastatin (LIPITOR) 10 MG tablet Take 1 tablet (10 mg total) by mouth daily. For cholesterol 02/04/19  Yes Stacks, Cletus Gash, MD  ibuprofen (ADVIL) 200 MG tablet Take 400 mg by mouth every 6 (six) hours as needed for moderate pain.    Yes [provider]  polyethylene glycol-electrolytes (TRILYTE) 420 g solution Take 4,000 mLs by mouth as directed. 04/18/19  Yes Daneil Dolin, MD    Allergies as of 04/18/2019  . (No Known Allergies)    Family History  Problem Relation Age of Onset  . Leukemia Mother   . Cancer Father        unknown primary  . Colon cancer Neg Hx     Social History   Socioeconomic History  . Marital status: Married    Spouse name: Not on file  . Number of children: Not on file  . Years of education: Not on file  . Highest education level: Not on file  Occupational History  . Not on file  Tobacco Use  . Smoking status: Former Research scientist (life sciences)  . Smokeless tobacco: Former Systems developer    Quit date: 05/29/2002  Substance and Sexual Activity  . Alcohol use: Yes    Comment: occasional  . Drug  use: No  . Sexual activity: Not on file  Other Topics Concern  . Not on file  Social History Narrative  . Not on file   Social Determinants of Health   Financial Resource Strain:   . Difficulty of Paying Living Expenses:   Food Insecurity:   . Worried About Charity fundraiser in the Last Year:   . Arboriculturist in the Last Year:   Transportation Needs:   . Film/video editor (Medical):   Marland Kitchen Lack of Transportation (Non-Medical):   Physical Activity:   . Days of Exercise per Week:   . Minutes of Exercise per Session:   Stress:   . Feeling of Stress :   Social Connections:   . Frequency of Communication with Friends and Family:   . Frequency of Social Gatherings with Friends and Family:   . Attends Religious Services:   . Active Member of Clubs or Organizations:   . Attends Archivist Meetings:   Marland Kitchen Marital Status:   Intimate Partner Violence:   . Fear of Current or Ex-Partner:   . Emotionally Abused:   Marland Kitchen Physically Abused:   . Sexually Abused:     Review of Systems: See HPI, otherwise negative ROS  Physical Exam: BP (!) 169/67   Pulse (!) 56   Temp 98.1 F (36.7 C) (Oral)   Resp 18   Ht 5\' 11"  (1.803 m)   Wt 131.5 kg   SpO2 96%   BMI 40.45 kg/m  General:   Alert,  Well-developed, well-nourished, pleasant and cooperative in NAD Lungs:  Clear throughout to auscultation.   No wheezes, crackles, or rhonchi. No acute distress. Heart:  Regular rate and rhythm; no murmurs, clicks, rubs,  or gallops. Abdomen:  Soft, nontender and nondistended. No masses, hepatosplenomegaly or hernias noted. Normal bowel sounds, without guarding, and without rebound.   Impression/Plan: Jeffrey Stark is now here to undergo a screening colonoscopy.  Average rescreening examination.  Risks, benefits, limitations, imponderables and alternatives regarding colonoscopy have been reviewed with the patient. Questions have been answered. All parties agreeable.     Notice:  This  dictation was prepared with Dragon dictation along with smaller phrase technology. Any transcriptional errors that result from this process are unintentional and may not be corrected upon review.

## 2019-08-15 NOTE — Op Note (Signed)
[facility] Patient Name: Jeffrey Stark Procedure Date: 08/15/2019 11:25 AM MRN: [id number] Date of Birth: 02-12-1966 Attending MD: Norvel Richards , MD CSN: [id number] Age: 54 Admit Type: Outpatient Procedure:                Colonoscopy Indications:              Screening for colorectal malignant neoplasm Providers:                Norvel Richards, MD, Jeanann Lewandowsky. Sharon Seller, RN,                            Raphael Gibney, Technician Referring MD:              Medicines:                Propofol per Anesthesia Complications:            No immediate complications. Estimated Blood Loss:     Estimated blood loss: none. Procedure:                Pre-Anesthesia Assessment:                           - Prior to the procedure, a History and Physical                            was performed, and patient medications and                            allergies were reviewed. The patient's tolerance of                            previous anesthesia was also reviewed. The risks                            and benefits of the procedure and the sedation                            options and risks were discussed with the patient.                            All questions were answered, and informed consent                            was obtained. ASA Grade Assessment: II - A patient                            with mild systemic disease. After reviewing the                            risks and benefits, the patient was deemed in                            satisfactory condition to undergo the procedure.  After obtaining informed consent, the colonoscope                            was passed under direct vision. Throughout the                            procedure, the patient's blood pressure, pulse, and                            oxygen saturations were monitored continuously. The                            [id number]) scope was introduced through            the anus and advanced to the the cecum, identified                            by appendiceal orifice and ileocecal valve. The                            colonoscopy was performed without difficulty. The                            patient tolerated the procedure well. The quality                            of the bowel preparation was adequate. Scope In: 11:40:19 AM Scope Out: 11:55:19 AM Scope Withdrawal Time: 0 hours 10 minutes 1 second  Total Procedure Duration: 0 hours 15 minutes 0 seconds  Findings:      The perianal and digital rectal examinations were normal.      Scattered small-mouthed diverticula were found in the sigmoid colon and       descending colon.      A 5 mm polyp was found in the distal rectum. The polyp was sessile. The       polyp was removed with a cold snare. Resection and retrieval were       complete. Estimated blood loss was minimal.      The exam was otherwise without abnormality on direct and retroflexion       views. Impression:               - Diverticulosis in the sigmoid colon and in the                            descending colon.                           - One 5 mm polyp in the distal rectum, removed with                            a cold snare. Resected and retrieved.                           - The examination was otherwise normal on direct  and retroflexion views. Moderate Sedation:      Moderate (conscious) sedation was personally administered by an       anesthesia professional. The following parameters were monitored: oxygen       saturation, heart rate, blood pressure, respiratory rate, EKG, adequacy       of pulmonary ventilation, and response to care. Recommendation:           - Patient has a contact number available for                            emergencies. The signs and symptoms of potential                            delayed complications were discussed with the                            patient.  Return to normal activities tomorrow.                            Written discharge instructions were provided to the                            patient.                           - Resume previous diet.                           - Continue present medications.                           - Repeat colonoscopy date to be determined after                            pending pathology results are reviewed for                            surveillance.                           - Return to GI office (date not yet determined). Procedure Code(s):        --- Professional ---                           [phone], Colonoscopy, flexible; with removal of                            tumor(s), polyp(s), or other lesion(s) by snare                            technique Diagnosis Code(s):        --- Professional ---                           Z12.11, Encounter for screening for malignant  neoplasm of colon                           K62.1, Rectal polyp                           K57.30, Diverticulosis of large intestine without                            perforation or abscess without bleeding CPT copyright 2019 American Medical Association. All rights reserved. The codes documented in this report are preliminary and upon coder review may  be revised to meet current compliance requirements. Cristopher Estimable. Adelynn Gipe, MD Norvel Richards, MD 08/15/2019 12:01:46 PM This report has been signed electronically. Number of Addenda: 0

## 2019-08-15 NOTE — Anesthesia Postprocedure Evaluation (Signed)
Anesthesia Post Note  Patient: Jeffrey Stark  Procedure(s) Performed: COLONOSCOPY WITH PROPOFOL (N/A ) POLYPECTOMY  Patient location during evaluation: PACU Anesthesia Type: General Level of consciousness: awake and alert Pain management: pain level controlled Vital Signs Assessment: post-procedure vital signs reviewed and stable Respiratory status: spontaneous breathing Cardiovascular status: stable Postop Assessment: no apparent nausea or vomiting Anesthetic complications: no     Last Vitals:  Vitals:   08/15/19 1020  BP: (!) 169/67  Pulse: (!) 56  Resp: 18  Temp: 36.7 C  SpO2: 96%    Last Pain:  Vitals:   08/15/19 1133  TempSrc:   PainSc: 0-No pain                 Everette Rank

## 2019-08-15 NOTE — Discharge Instructions (Signed)
Colon Polyps  Polyps are tissue growths inside the body. Polyps can grow in many places, including the large intestine (colon). A polyp may be a round bump or a mushroom-shaped growth. You could have one polyp or several. Most colon polyps are noncancerous (benign). However, some colon polyps can become cancerous over time. Finding and removing the polyps early can help prevent this. What are the causes? The exact cause of colon polyps is not known. What increases the risk? You are more likely to develop this condition if you:  Have a family history of colon cancer or colon polyps.  Are older than 55 or older than 45 if you are African American.  Have inflammatory bowel disease, such as ulcerative colitis or Crohn's disease.  Have certain hereditary conditions, such as: ? Familial adenomatous polyposis. ? Lynch syndrome. ? Turcot syndrome. ? Peutz-Jeghers syndrome.  Are overweight.  Smoke cigarettes.  Do not get enough exercise.  Drink too much alcohol.  Eat a diet that is high in fat and red meat and low in fiber.  Had childhood cancer that was treated with abdominal radiation. What are the signs or symptoms? Most polyps do not cause symptoms. If you have symptoms, they may include:  Blood coming from your rectum when having a bowel movement.  Blood in your stool. The stool may look dark red or black.  Abdominal pain.  A change in bowel habits, such as constipation or diarrhea. How is this diagnosed? This condition is diagnosed with a colonoscopy. This is a procedure in which a lighted, flexible scope is inserted into the anus and then passed into the colon to examine the area. Polyps are sometimes found when a colonoscopy is done as part of routine cancer screening tests. How is this treated? Treatment for this condition involves removing any polyps that are found. Most polyps can be removed during a colonoscopy. Those polyps will then be tested for cancer. Additional  treatment may be needed depending on the results of testing. Follow these instructions at home: Lifestyle  Maintain a healthy weight, or lose weight if recommended by your health care provider.  Exercise every day or as told by your health care provider.  Do not use any products that contain nicotine or tobacco, such as cigarettes and e-cigarettes. If you need help quitting, ask your health care provider.  If you drink alcohol, limit how much you have: ? 0-1 drink a day for women. ? 0-2 drinks a day for men.  Be aware of how much alcohol is in your drink. In the [place] one drink equals one 12 oz bottle of beer (355 mL), one 5 oz glass of wine (148 mL), or one 1 oz shot of hard liquor (44 mL). Eating and drinking   Eat foods that are high in fiber, such as fruits, vegetables, and whole grains.  Eat foods that are high in calcium and vitamin D, such as milk, cheese, yogurt, eggs, liver, fish, and broccoli.  Limit foods that are high in fat, such as fried foods and desserts.  Limit the amount of red meat and processed meat you eat, such as hot dogs, sausage, bacon, and lunch meats. General instructions  Keep all follow-up visits as told by your health care provider. This is important. ? This includes having regularly scheduled colonoscopies. ? Talk to your health care provider about when you need a colonoscopy. Contact a health care provider if:  You have new or worsening bleeding during a bowel movement.  You  have new or increased blood in your stool.  You have a change in bowel habits.  You lose weight for no known reason. Summary  Polyps are tissue growths inside the body. Polyps can grow in many places, including the colon.  Most colon polyps are noncancerous (benign), but some can become cancerous over time.  This condition is diagnosed with a colonoscopy.  Treatment for this condition involves removing any polyps that are found. Most polyps can be removed during a  colonoscopy. This information is not intended to replace advice given to you by your health care provider. Make sure you discuss any questions you have with your health care provider. Document Revised: 08/30/2017 Document Reviewed: 08/30/2017 Elsevier Patient Education  [place]. Diverticulosis  Diverticulosis is a condition that develops when small pouches (diverticula) form in the wall of the large intestine (colon). The colon is where water is absorbed and stool (feces) is formed. The pouches form when the inside layer of the colon pushes through weak spots in the outer layers of the colon. You may have a few pouches or many of them. The pouches usually do not cause problems unless they become inflamed or infected. When this happens, the condition is called diverticulitis. What are the causes? The cause of this condition is not known. What increases the risk? The following factors may make you more likely to develop this condition:  Being older than age 79. Your risk for this condition increases with age. Diverticulosis is rare among people younger than age 82. By age 43, many people have it.  Eating a low-fiber diet.  Having frequent constipation.  Being overweight.  Not getting enough exercise.  Smoking.  Taking over-the-counter pain medicines, like aspirin and ibuprofen.  Having a family history of diverticulosis. What are the signs or symptoms? In most people, there are no symptoms of this condition. If you do have symptoms, they may include:  Bloating.  Cramps in the abdomen.  Constipation or diarrhea.  Pain in the lower left side of the abdomen. How is this diagnosed? Because diverticulosis usually has no symptoms, it is most often diagnosed during an exam for other colon problems. The condition may be diagnosed by:  Using a flexible scope to examine the colon (colonoscopy).  Taking an X-ray of the colon after dye has been put into the colon (barium  enema).  Having a CT scan. How is this treated? You may not need treatment for this condition. Your health care provider may recommend treatment to prevent problems. You may need treatment if you have symptoms or if you previously had diverticulitis. Treatment may include:  Eating a high-fiber diet.  Taking a fiber supplement.  Taking a live bacteria supplement (probiotic).  Taking medicine to relax your colon. Follow these instructions at home: Medicines  Take over-the-counter and prescription medicines only as told by your health care provider.  If told by your health care provider, take a fiber supplement or probiotic. Constipation prevention Your condition may cause constipation. To prevent or treat constipation, you may need to:  Drink enough fluid to keep your urine pale yellow.  Take over-the-counter or prescription medicines.  Eat foods that are high in fiber, such as beans, whole grains, and fresh fruits and vegetables.  Limit foods that are high in fat and processed sugars, such as fried or sweet foods.  General instructions  Try not to strain when you have a bowel movement.  Keep all follow-up visits as told by your health care  provider. This is important. Contact a health care provider if you:  Have pain in your abdomen.  Have bloating.  Have cramps.  Have not had a bowel movement in 3 days. Get help right away if:  Your pain gets worse.  Your bloating becomes very bad.  You have a fever or chills, and your symptoms suddenly get worse.  You vomit.  You have bowel movements that are bloody or black.  You have bleeding from your rectum. Summary  Diverticulosis is a condition that develops when small pouches (diverticula) form in the wall of the large intestine (colon).  You may have a few pouches or many of them.  This condition is most often diagnosed during an exam for other colon problems.  Treatment may include increasing the fiber in  your diet, taking supplements, or taking medicines. This information is not intended to replace advice given to you by your health care provider. Make sure you discuss any questions you have with your health care provider. Document Revised: 12/12/2018 Document Reviewed: 12/12/2018 Elsevier Patient Education  [place].  Colonoscopy Discharge Instructions  Read the instructions outlined below and refer to this sheet in the next few weeks. These discharge instructions provide you with general information on caring for yourself after you leave the hospital. Your doctor may also give you specific instructions. While your treatment has been planned according to the most current medical practices available, unavoidable complications occasionally occur. If you have any problems or questions after discharge, call Dr. Gala Romney at [phone]. ACTIVITY  You may resume your regular activity, but move at a slower pace for the next 24 hours.   Take frequent rest periods for the next 24 hours.   Walking will help get rid of the air and reduce the bloated feeling in your belly (abdomen).   No driving for 24 hours (because of the medicine (anesthesia) used during the test).    Do not sign any important legal documents or operate any machinery for 24 hours (because of the anesthesia used during the test).  NUTRITION  Drink plenty of fluids.   You may resume your normal diet as instructed by your doctor.   Begin with a light meal and progress to your normal diet. Heavy or fried foods are harder to digest and may make you feel sick to your stomach (nauseated).   Avoid alcoholic beverages for 24 hours or as instructed.  MEDICATIONS  You may resume your normal medications unless your doctor tells you otherwise.  WHAT YOU CAN EXPECT TODAY  Some feelings of bloating in the abdomen.   Passage of more gas than usual.   Spotting of blood in your stool or on the toilet paper.  IF YOU HAD POLYPS  REMOVED DURING THE COLONOSCOPY:  No aspirin products for 7 days or as instructed.   No alcohol for 7 days or as instructed.   Eat a soft diet for the next 24 hours.  FINDING OUT THE RESULTS OF YOUR TEST Not all test results are available during your visit. If your test results are not back during the visit, make an appointment with your caregiver to find out the results. Do not assume everything is normal if you have not heard from your caregiver or the medical facility. It is important for you to follow up on all of your test results.  SEEK IMMEDIATE MEDICAL ATTENTION IF:  You have more than a spotting of blood in your stool.   Your belly is swollen (  abdominal distention).   You are nauseated or vomiting.   You have a temperature over 101.   You have abdominal pain or discomfort that is severe or gets worse throughout the day.   Diverticulosis and colon polyp information provided  Further recommendations to follow pending review of pathology report  At patient request, I called Karie Mainland at [phone] and reviewed results.

## 2019-08-15 NOTE — Anesthesia Preprocedure Evaluation (Signed)
Anesthesia Evaluation  Patient identified by MRN, date of birth, ID band Patient awake    Reviewed: Allergy & Precautions, NPO status , Patient's Chart, lab work & pertinent test results  Airway Mallampati: III  TM Distance: >3 FB Neck ROM: Full    Dental  (+) Partial Upper, Missing, Dental Advisory Given   Pulmonary former smoker,    breath sounds clear to auscultation       Cardiovascular Exercise Tolerance: Good hypertension, Pt. on medications  Rhythm:Regular Rate:Normal     Neuro/Psych    GI/Hepatic negative GI ROS, (+)     substance abuse  alcohol use,   Endo/Other  negative endocrine ROS  Renal/GU negative Renal ROS  negative genitourinary   Musculoskeletal negative musculoskeletal ROS (+)   Abdominal   Peds  Hematology negative hematology ROS (+)   Anesthesia Other Findings   Reproductive/Obstetrics negative OB ROS                             Anesthesia Physical Anesthesia Plan  ASA: III  Anesthesia Plan: General   Post-op Pain Management:    Induction: Intravenous  PONV Risk Score and Plan: 2 and Treatment may vary due to age or medical condition  Airway Management Planned:   Additional Equipment:   Intra-op Plan:   Post-operative Plan:   Informed Consent: I have reviewed the patients History and Physical, chart, labs and discussed the procedure including the risks, benefits and alternatives for the proposed anesthesia with the patient or authorized representative who has indicated his/her understanding and acceptance.     Dental advisory given  Plan Discussed with: CRNA  Anesthesia Plan Comments:         Anesthesia Quick Evaluation

## 2019-08-18 LAB — SURGICAL PATHOLOGY

## 2019-08-19 ENCOUNTER — Encounter: Payer: Self-pay | Admitting: Internal Medicine

## 2020-01-01 ENCOUNTER — Other Ambulatory Visit: Payer: Self-pay | Admitting: Family Medicine

## 2020-02-05 ENCOUNTER — Other Ambulatory Visit: Payer: Self-pay

## 2020-02-05 ENCOUNTER — Ambulatory Visit (INDEPENDENT_AMBULATORY_CARE_PROVIDER_SITE_OTHER): Payer: Commercial Managed Care - PPO | Admitting: Family Medicine

## 2020-02-05 ENCOUNTER — Encounter: Payer: Self-pay | Admitting: Family Medicine

## 2020-02-05 VITALS — BP 170/83 | HR 68 | Temp 97.9°F | Ht 71.0 in | Wt 322.0 lb

## 2020-02-05 DIAGNOSIS — E785 Hyperlipidemia, unspecified: Secondary | ICD-10-CM

## 2020-02-05 DIAGNOSIS — I10 Essential (primary) hypertension: Secondary | ICD-10-CM

## 2020-02-05 DIAGNOSIS — Z125 Encounter for screening for malignant neoplasm of prostate: Secondary | ICD-10-CM

## 2020-02-05 MED ORDER — ATORVASTATIN CALCIUM 10 MG PO TABS: 10.0000 mg | ORAL_TABLET | Freq: Every day | ORAL | 1 refills | Status: DC

## 2020-02-05 MED ORDER — AMLODIPINE BESYLATE 10 MG PO TABS: 10.0000 mg | ORAL_TABLET | Freq: Every day | ORAL | 3 refills | Status: DC

## 2020-02-05 NOTE — Progress Notes (Signed)
Subjective:  Patient ID: Jeffrey Stark, male    DOB: 12-Jul-1965  Age: 54 y.o. MRN: [id number]  CC: Follow-up   HPI Jeffrey Stark presents for  follow-up of hypertension. Patient has no history of headache chest pain or shortness of breath or recent cough. Patient also denies symptoms of TIA such as focal numbness or weakness. Patient denies side effects from medication. States taking it regularly.  Patient in for follow-up of elevated cholesterol. Doing well without complaints on current medication. Denies side effects of statin including myalgia and arthralgia and nausea. Also in today for liver function testing. Currently no chest pain, shortness of breath or other cardiovascular related symptoms noted.   History Jeffrey Stark has a past medical history of DVT (deep venous thrombosis) ([place]) (2010), Gout, Hyperlipidemia, Hypertension, and Pulmonary emboli ([place]) (2010).   He has a past surgical history that includes Colonoscopy; Cyst excision; Colonoscopy with propofol (N/A, 08/15/2019); and polypectomy (08/15/2019).   His family history includes Cancer in his father; Leukemia in his mother.He reports that he has quit smoking. He quit smokeless tobacco use about 17 years ago. He reports current alcohol use. He reports that he does not use drugs.  Current Outpatient Medications on File Prior to Visit  Medication Sig Dispense Refill  . ibuprofen (ADVIL) 200 MG tablet Take 400 mg by mouth every 6 (six) hours as needed for moderate pain.      No current facility-administered medications on file prior to visit.    ROS Review of Systems  Constitutional: Negative for fever.  Respiratory: Negative for shortness of breath.   Cardiovascular: Negative for chest pain.  Musculoskeletal: Negative for arthralgias.  Skin: Negative for rash.    Objective:  BP (!) 170/83   Pulse 68   Temp 97.9 F (36.6 C) (Temporal)   Ht 5' 11"  (1.803 m)   Wt (!) 322 lb (146.1 kg)   BMI 44.91 kg/m   BP Readings  from Last 3 Encounters:  02/05/20 (!) 170/83  08/15/19 (!) 195/89  04/18/19 (!) 162/97    Wt Readings from Last 3 Encounters:  02/05/20 (!) 322 lb (146.1 kg)  08/15/19 290 lb (131.5 kg)  04/18/19 (!) 312 lb 3.2 oz (141.6 kg)     Physical Exam Vitals reviewed.  Constitutional:      Appearance: He is well-developed.  HENT:     Head: Normocephalic and atraumatic.     Right Ear: External ear normal.     Left Ear: External ear normal.     Mouth/Throat:     Pharynx: No oropharyngeal exudate or posterior oropharyngeal erythema.  Eyes:     Pupils: Pupils are equal, round, and reactive to light.  Cardiovascular:     Rate and Rhythm: Normal rate and regular rhythm.     Heart sounds: No murmur heard.   Pulmonary:     Effort: No respiratory distress.     Breath sounds: Normal breath sounds.  Musculoskeletal:     Cervical back: Normal range of motion and neck supple.  Neurological:     Mental Status: He is alert and oriented to person, place, and time.       Assessment & Plan:   Jeffrey Stark was seen today for follow-up.  Diagnoses and all orders for this visit:  Essential hypertension -     CBC with Differential/Platelet -     CMP14+EGFR  Screening for prostate cancer -     PSA Total (Reflex To Free)  Morbid obesity ([place]) -  CBC with Differential/Platelet -     CMP14+EGFR  Hyperlipidemia LDL goal <130 -     CBC with Differential/Platelet -     CMP14+EGFR -     Lipid panel  Other orders -     amLODipine (NORVASC) 10 MG tablet; Take 1 tablet (10 mg total) by mouth daily. (Needs to be seen before next refill) -     atorvastatin (LIPITOR) 10 MG tablet; Take 1 tablet (10 mg total) by mouth daily. (Needs to be seen before next refill)   Allergies as of 02/05/2020   No Known Allergies     Medication List       Accurate as of February 05, 2020 11:59 PM. If you have any questions, ask your nurse or doctor.        STOP taking these medications   polyethylene  glycol-electrolytes 420 g solution Commonly known as: TriLyte Stopped by: Claretta Fraise, MD     TAKE these medications   amLODipine 10 MG tablet Commonly known as: NORVASC Take 1 tablet (10 mg total) by mouth daily. (Needs to be seen before next refill) What changed:   medication strength  how much to take Changed by: Claretta Fraise, MD   atorvastatin 10 MG tablet Commonly known as: LIPITOR Take 1 tablet (10 mg total) by mouth daily. (Needs to be seen before next refill)   ibuprofen 200 MG tablet Commonly known as: ADVIL Take 400 mg by mouth every 6 (six) hours as needed for moderate pain.       Meds ordered this encounter  Medications  . amLODipine (NORVASC) 10 MG tablet    Sig: Take 1 tablet (10 mg total) by mouth daily. (Needs to be seen before next refill)    Dispense:  90 tablet    Refill:  3  . atorvastatin (LIPITOR) 10 MG tablet    Sig: Take 1 tablet (10 mg total) by mouth daily. (Needs to be seen before next refill)    Dispense:  90 tablet    Refill:  1      Follow-up: Return in about 6 months (around 08/04/2020).  Claretta Fraise, M.D.

## 2020-02-06 LAB — CMP14+EGFR
ALT: 21 IU/L (ref 0–44)
AST: 17 IU/L (ref 0–40)
Albumin/Globulin Ratio: 1.3 (ref 1.2–2.2)
Albumin: 4.1 g/dL (ref 3.8–4.9)
Alkaline Phosphatase: 131 IU/L — ABNORMAL HIGH (ref 48–121)
BUN/Creatinine Ratio: 13 (ref 9–20)
BUN: 11 mg/dL (ref 6–24)
Bilirubin Total: 0.4 mg/dL (ref 0.0–1.2)
CO2: 23 mmol/L (ref 20–29)
Calcium: 9.2 mg/dL (ref 8.7–10.2)
Chloride: 105 mmol/L (ref 96–106)
Creatinine, Ser: 0.86 mg/dL (ref 0.76–1.27)
GFR calc Af Amer: 113 mL/min/{1.73_m2} (ref >59)
GFR calc non Af Amer: 98 mL/min/{1.73_m2} (ref >59)
Globulin, Total: 3.2 g/dL (ref 1.5–4.5)
Glucose: 96 mg/dL (ref 65–99)
Potassium: 3.8 mmol/L (ref 3.5–5.2)
Sodium: 141 mmol/L (ref 134–144)
Total Protein: 7.3 g/dL (ref 6.0–8.5)

## 2020-02-06 LAB — LIPID PANEL
Chol/HDL Ratio: 3.4 ratio (ref 0.0–5.0)
Cholesterol, Total: 193 mg/dL (ref 100–199)
HDL: 56 mg/dL (ref >39)
LDL Chol Calc (NIH): 123 mg/dL — ABNORMAL HIGH (ref 0–99)
Triglycerides: 75 mg/dL (ref 0–149)
VLDL Cholesterol Cal: 14 mg/dL (ref 5–40)

## 2020-02-06 LAB — CBC WITH DIFFERENTIAL/PLATELET
Basophils Absolute: 0 10*3/uL (ref 0.0–0.2)
Basos: 0 %
EOS (ABSOLUTE): 0.2 10*3/uL (ref 0.0–0.4)
Eos: 2 %
Hematocrit: 42 % (ref 37.5–51.0)
Hemoglobin: 13.9 g/dL (ref 13.0–17.7)
Immature Grans (Abs): 0 10*3/uL (ref 0.0–0.1)
Immature Granulocytes: 0 %
Lymphocytes Absolute: 2.2 10*3/uL (ref 0.7–3.1)
Lymphs: 23 %
MCH: 28 pg (ref 26.6–33.0)
MCHC: 33.1 g/dL (ref 31.5–35.7)
MCV: 85 fL (ref 79–97)
Monocytes Absolute: 0.9 10*3/uL (ref 0.1–0.9)
Monocytes: 9 %
Neutrophils Absolute: 6.1 10*3/uL (ref 1.4–7.0)
Neutrophils: 66 %
Platelets: 259 10*3/uL (ref 150–450)
RBC: 4.97 x10E6/uL (ref 4.14–5.80)
RDW: 14.8 % (ref 11.6–15.4)
WBC: 9.4 10*3/uL (ref 3.4–10.8)

## 2020-02-06 LAB — PSA TOTAL (REFLEX TO FREE): Prostate Specific Ag, Serum: 0.7 ng/mL (ref 0.0–4.0)

## 2020-02-09 NOTE — Progress Notes (Signed)
Hello Jeffrey Stark,  Your lab result is normal and/or stable.Some minor variations that are not significant are commonly marked abnormal, but do not represent any medical problem for you.  Best regards, Lucianne Smestad, M.D.

## 2020-02-12 ENCOUNTER — Encounter: Payer: Self-pay | Admitting: Family Medicine

## 2020-02-16 ENCOUNTER — Other Ambulatory Visit: Payer: Self-pay | Admitting: Family Medicine

## 2020-02-22 ENCOUNTER — Other Ambulatory Visit: Payer: Self-pay | Admitting: Family Medicine

## 2020-04-19 IMAGING — DX DG SHOULDER 2+V*R*
2 series · 2 of 2 positions shown · non-contrast
Comparison: None.

CLINICAL DATA: Knot on right shoulder, no known injury

EXAM:
RIGHT SHOULDER - 2+ VIEW

[shoulder obl]
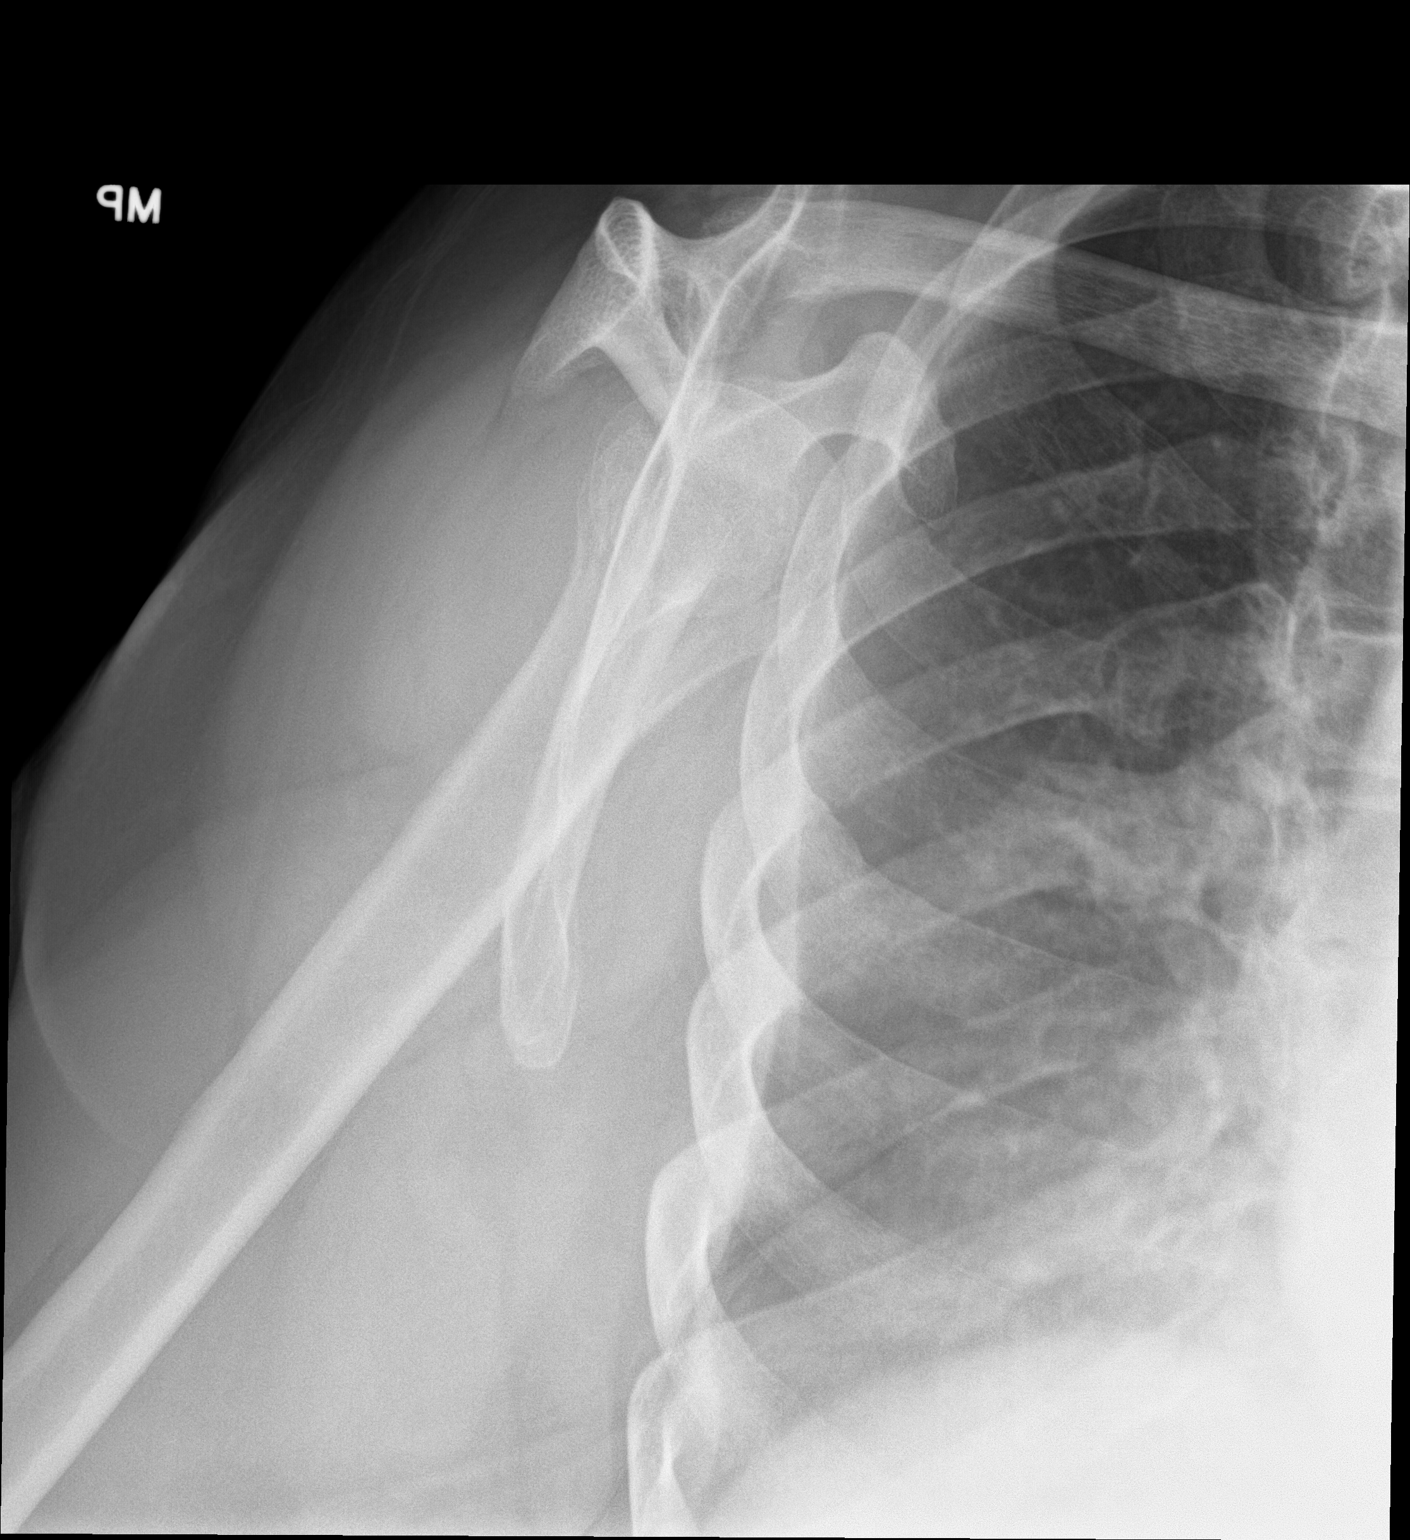

[shoulder ap]
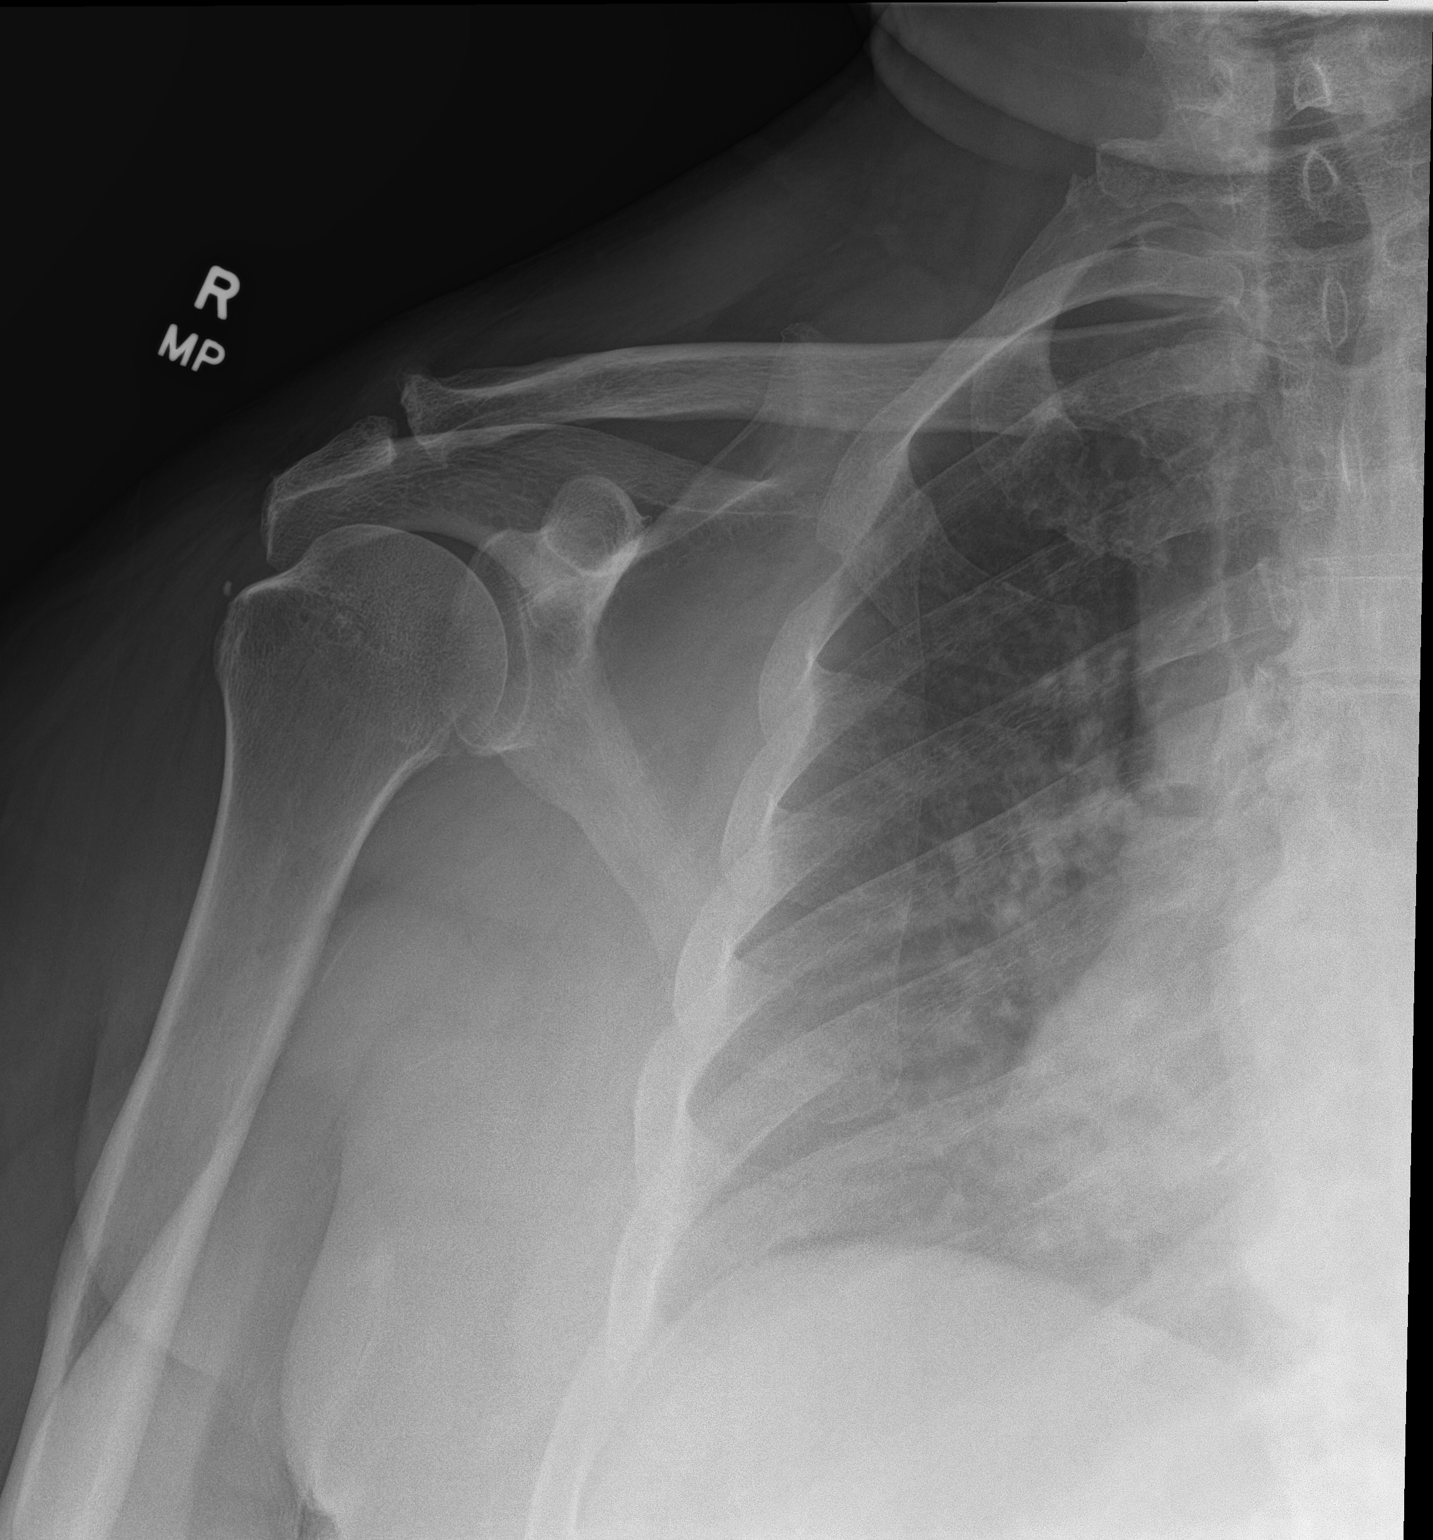

[2 of 2 positions shown; findings below may reference images not displayed]

FINDINGS: No acute fracture or dislocation of the right shoulder. There is a
probable Hill-Sachs deformity of the humeral head. There is moderate
acromioclavicular arthrosis. There is a small calcific body adjacent
to the expected insertion of the rotator cuff on the greater
tuberosity of the humerus, which may be related to chronic rotator
cuff tendinopathy. The partially imaged right chest is unremarkable.
IMPRESSION: 1. No acute fracture or dislocation of the right shoulder. There is
a probable Hill-Sachs deformity of the humeral head suggesting prior
shoulder dislocation.

2. There is moderate acromioclavicular arthrosis. Osteophytes may
explain palpable abnormality. The glenohumeral joint is preserved.

3. There is a small calcific body adjacent to the expected insertion
of the rotator cuff on the greater tuberosity of the humerus, which
may be related to chronic rotator cuff tendinopathy.

## 2020-08-04 ENCOUNTER — Ambulatory Visit: Payer: Commercial Managed Care - PPO | Admitting: Family Medicine

## 2020-08-04 ENCOUNTER — Encounter: Payer: Self-pay | Admitting: Family Medicine

## 2020-09-08 ENCOUNTER — Other Ambulatory Visit: Payer: Self-pay

## 2020-09-08 ENCOUNTER — Ambulatory Visit: Payer: Commercial Managed Care - PPO | Admitting: Family Medicine

## 2020-09-08 ENCOUNTER — Encounter: Payer: Self-pay | Admitting: Family Medicine

## 2020-09-08 VITALS — BP 137/84 | HR 70 | Temp 97.7°F | Ht 71.0 in | Wt 310.4 lb

## 2020-09-08 DIAGNOSIS — I10 Essential (primary) hypertension: Secondary | ICD-10-CM | POA: Diagnosis not present

## 2020-09-08 DIAGNOSIS — Z114 Encounter for screening for human immunodeficiency virus [HIV]: Secondary | ICD-10-CM

## 2020-09-08 DIAGNOSIS — E785 Hyperlipidemia, unspecified: Secondary | ICD-10-CM | POA: Diagnosis not present

## 2020-09-08 DIAGNOSIS — E790 Hyperuricemia without signs of inflammatory arthritis and tophaceous disease: Secondary | ICD-10-CM | POA: Insufficient documentation

## 2020-09-08 DIAGNOSIS — Z1159 Encounter for screening for other viral diseases: Secondary | ICD-10-CM

## 2020-09-08 DIAGNOSIS — R7989 Other specified abnormal findings of blood chemistry: Secondary | ICD-10-CM | POA: Diagnosis not present

## 2020-09-08 DIAGNOSIS — Z125 Encounter for screening for malignant neoplasm of prostate: Secondary | ICD-10-CM

## 2020-09-08 DIAGNOSIS — B351 Tinea unguium: Secondary | ICD-10-CM

## 2020-09-08 MED ORDER — TERBINAFINE HCL 250 MG PO TABS: 250.0000 mg | ORAL_TABLET | Freq: Every day | ORAL | 2 refills | Status: DC

## 2020-09-08 MED ORDER — AMLODIPINE BESYLATE 10 MG PO TABS: 10.0000 mg | ORAL_TABLET | Freq: Every day | ORAL | 3 refills | Status: DC

## 2020-09-08 NOTE — Progress Notes (Signed)
Subjective:  Patient ID: Jeffrey Stark, male    DOB: January 10, 1966  Age: 55 y.o. MRN: [id number]  CC: Medical Management of Chronic Issues   HPI Jeffrey Stark presents for  follow-up of hypertension. Patient has no history of headache chest pain or shortness of breath or recent cough. Patient also denies symptoms of TIA such as focal numbness or weakness. Patient denies side effects from medication. States taking it regularly.   History Jeffrey Stark has a past medical history of DVT (deep venous thrombosis) ([place]) (2010), Gout, Hyperlipidemia, Hypertension, and Pulmonary emboli ([place]) (2010).   He has a past surgical history that includes Colonoscopy; Cyst excision; Colonoscopy with propofol (N/A, 08/15/2019); and polypectomy (08/15/2019).   His family history includes Cancer in his father; Leukemia in his mother.He reports that he has quit smoking. He quit smokeless tobacco use about 18 years ago. He reports current alcohol use. He reports that he does not use drugs.  Current Outpatient Medications on File Prior to Visit  Medication Sig Dispense Refill  . atorvastatin (LIPITOR) 10 MG tablet Take 1 tablet (10 mg total) by mouth daily. (Needs to be seen before next refill) 90 tablet 1  . ibuprofen (ADVIL) 200 MG tablet Take 400 mg by mouth every 6 (six) hours as needed for moderate pain.      No current facility-administered medications on file prior to visit.    ROS Review of Systems  Constitutional: Negative for fever.  Respiratory: Negative for shortness of breath.   Cardiovascular: Negative for chest pain.  Musculoskeletal: Positive for arthralgias (Intermittent as a result of gout).  Skin: Negative for rash.    Objective:  BP 137/84   Pulse 70   Temp 97.7 F (36.5 C)   Ht 5' 11"  (1.803 m)   Wt (!) 310 lb 6.4 oz (140.8 kg)   SpO2 96%   BMI 43.29 kg/m   BP Readings from Last 3 Encounters:  09/08/20 137/84  02/05/20 (!) 170/83  08/15/19 (!) 195/89    Wt Readings from Last 3  Encounters:  09/08/20 (!) 310 lb 6.4 oz (140.8 kg)  02/05/20 (!) 322 lb (146.1 kg)  08/15/19 290 lb (131.5 kg)     Physical Exam Vitals reviewed.  Constitutional:      Appearance: He is well-developed.  HENT:     Head: Normocephalic and atraumatic.     Right Ear: Tympanic membrane and external ear normal. No decreased hearing noted.     Left Ear: Tympanic membrane and external ear normal. No decreased hearing noted.     Mouth/Throat:     Pharynx: No oropharyngeal exudate or posterior oropharyngeal erythema.  Eyes:     Pupils: Pupils are equal, round, and reactive to light.  Cardiovascular:     Rate and Rhythm: Normal rate and regular rhythm.     Heart sounds: No murmur heard.   Pulmonary:     Effort: No respiratory distress.     Breath sounds: Normal breath sounds.  Abdominal:     General: Bowel sounds are normal.     Palpations: Abdomen is soft. There is no mass.     Tenderness: There is no abdominal tenderness.  Musculoskeletal:     Cervical back: Normal range of motion and neck supple.  Skin:    Comments: There is marked onychomycosis of each nail of both feet.       Assessment & Plan:   Jeffrey Stark was seen today for medical management of chronic issues.  Diagnoses and all  orders for this visit:  Primary hypertension -     amLODipine (NORVASC) 10 MG tablet; Take 1 tablet (10 mg total) by mouth daily. (Needs to be seen before next refill) -     CBC with Differential/Platelet -     CMP14+EGFR  Hyperlipidemia LDL goal <130 -     CBC with Differential/Platelet -     CMP14+EGFR -     Lipid panel  Morbid obesity ([facility]) -     CBC with Differential/Platelet -     CMP14+EGFR  Low serum vitamin D -     VITAMIN D 25 Hydroxy (Vit-D Deficiency, Fractures)  Screening for prostate cancer -     PSA, total and free  Need for hepatitis C screening test -     Hepatitis C antibody  Encounter for screening for HIV -     HIV Antibody (routine testing w  rflx)  Hyperuricemia -     Uric acid  Onychomycosis -     terbinafine (LAMISIL) 250 MG tablet; Take 1 tablet (250 mg total) by mouth daily.   Allergies as of 09/08/2020   No Known Allergies     Medication List       Accurate as of September 08, 2020  5:48 PM. If you have any questions, ask your nurse or doctor.        amLODipine 10 MG tablet Commonly known as: NORVASC Take 1 tablet (10 mg total) by mouth daily. (Needs to be seen before next refill)   atorvastatin 10 MG tablet Commonly known as: LIPITOR Take 1 tablet (10 mg total) by mouth daily. (Needs to be seen before next refill)   ibuprofen 200 MG tablet Commonly known as: ADVIL Take 400 mg by mouth every 6 (six) hours as needed for moderate pain.   terbinafine 250 MG tablet Commonly known as: LamISIL Take 1 tablet (250 mg total) by mouth daily. Started by: Jeffrey Fraise, MD       Meds ordered this encounter  Medications  . amLODipine (NORVASC) 10 MG tablet    Sig: Take 1 tablet (10 mg total) by mouth daily. (Needs to be seen before next refill)    Dispense:  90 tablet    Refill:  3  . terbinafine (LAMISIL) 250 MG tablet    Sig: Take 1 tablet (250 mg total) by mouth daily.    Dispense:  30 tablet    Refill:  2    Weight loss strategies reviewed in detail  Follow-up: Return in about 6 months (around 03/10/2021).  Jeffrey Stark, M.D.

## 2020-09-09 LAB — LIPID PANEL
Chol/HDL Ratio: 4.1 ratio (ref 0.0–5.0)
Cholesterol, Total: 203 mg/dL — ABNORMAL HIGH (ref 100–199)
HDL: 50 mg/dL (ref >39)
LDL Chol Calc (NIH): 127 mg/dL — ABNORMAL HIGH (ref 0–99)
Triglycerides: 144 mg/dL (ref 0–149)
VLDL Cholesterol Cal: 26 mg/dL (ref 5–40)

## 2020-09-09 LAB — CMP14+EGFR
ALT: 19 IU/L (ref 0–44)
AST: 20 IU/L (ref 0–40)
Albumin/Globulin Ratio: 1.4 (ref 1.2–2.2)
Albumin: 4.3 g/dL (ref 3.8–4.9)
Alkaline Phosphatase: 138 IU/L — ABNORMAL HIGH (ref 44–121)
BUN/Creatinine Ratio: 10 (ref 9–20)
BUN: 9 mg/dL (ref 6–24)
Bilirubin Total: 0.2 mg/dL (ref 0.0–1.2)
CO2: 22 mmol/L (ref 20–29)
Calcium: 9.4 mg/dL (ref 8.7–10.2)
Chloride: 104 mmol/L (ref 96–106)
Creatinine, Ser: 0.88 mg/dL (ref 0.76–1.27)
Globulin, Total: 3.1 g/dL (ref 1.5–4.5)
Glucose: 90 mg/dL (ref 65–99)
Potassium: 4.4 mmol/L (ref 3.5–5.2)
Sodium: 143 mmol/L (ref 134–144)
Total Protein: 7.4 g/dL (ref 6.0–8.5)
eGFR: 101 mL/min/{1.73_m2} (ref >59)

## 2020-09-09 LAB — VITAMIN D 25 HYDROXY (VIT D DEFICIENCY, FRACTURES): Vit D, 25-Hydroxy: 9.6 ng/mL — ABNORMAL LOW (ref 30.0–100.0)

## 2020-09-09 LAB — CBC WITH DIFFERENTIAL/PLATELET
Basophils Absolute: 0 10*3/uL (ref 0.0–0.2)
Basos: 1 %
EOS (ABSOLUTE): 0.2 10*3/uL (ref 0.0–0.4)
Eos: 3 %
Hematocrit: 44.1 % (ref 37.5–51.0)
Hemoglobin: 14.6 g/dL (ref 13.0–17.7)
Immature Grans (Abs): 0 10*3/uL (ref 0.0–0.1)
Immature Granulocytes: 0 %
Lymphocytes Absolute: 2.1 10*3/uL (ref 0.7–3.1)
Lymphs: 30 %
MCH: 28.2 pg (ref 26.6–33.0)
MCHC: 33.1 g/dL (ref 31.5–35.7)
MCV: 85 fL (ref 79–97)
Monocytes Absolute: 0.5 10*3/uL (ref 0.1–0.9)
Monocytes: 8 %
Neutrophils Absolute: 3.9 10*3/uL (ref 1.4–7.0)
Neutrophils: 58 %
Platelets: 293 10*3/uL (ref 150–450)
RBC: 5.18 x10E6/uL (ref 4.14–5.80)
RDW: 13.7 % (ref 11.6–15.4)
WBC: 6.8 10*3/uL (ref 3.4–10.8)

## 2020-09-09 LAB — HEPATITIS C ANTIBODY: Hep C Virus Ab: <0.1 s/co ratio (ref 0.0–0.9)

## 2020-09-09 LAB — PSA, TOTAL AND FREE
PSA, Free Pct: 15.7 %
PSA, Free: 0.11 ng/mL
Prostate Specific Ag, Serum: 0.7 ng/mL (ref 0.0–4.0)

## 2020-09-09 LAB — HIV ANTIBODY (ROUTINE TESTING W REFLEX): HIV Screen 4th Generation wRfx: NONREACTIVE

## 2020-09-09 LAB — URIC ACID: Uric Acid: 7.4 mg/dL (ref 3.8–8.4)

## 2020-09-13 ENCOUNTER — Other Ambulatory Visit: Payer: Self-pay | Admitting: *Deleted

## 2020-09-13 MED ORDER — VITAMIN D (ERGOCALCIFEROL) 1.25 MG (50000 UNIT) PO CAPS: 50000.0000 [IU] | ORAL_CAPSULE | ORAL | 3 refills | Status: DC

## 2020-09-13 NOTE — Progress Notes (Signed)
Dear Jeffrey Stark, Your Vitamin D is  low. You need a prescription strength supplement I will send that in for you. Nurse, if at all possible, could you send in a prescription for the patient for vitamin D 50,000 units, 1 p.o. weekly #13 with 3 refills? Many thanks, WS

## 2020-11-27 ENCOUNTER — Other Ambulatory Visit: Payer: Self-pay | Admitting: Family Medicine

## 2020-12-08 ENCOUNTER — Ambulatory Visit: Payer: Commercial Managed Care - PPO | Admitting: Family Medicine

## 2020-12-10 ENCOUNTER — Encounter: Payer: Self-pay | Admitting: Family Medicine

## 2020-12-20 ENCOUNTER — Telehealth: Payer: Self-pay | Admitting: Family Medicine

## 2020-12-21 NOTE — Telephone Encounter (Addendum)
Patient contacted.  Appointment made for 12/24/2020 with Marjorie Smolder at 10 am for suture/staple removal

## 2020-12-24 ENCOUNTER — Ambulatory Visit (INDEPENDENT_AMBULATORY_CARE_PROVIDER_SITE_OTHER): Payer: Commercial Managed Care - PPO | Admitting: Family Medicine

## 2020-12-24 ENCOUNTER — Other Ambulatory Visit: Payer: Self-pay

## 2020-12-24 ENCOUNTER — Encounter: Payer: Self-pay | Admitting: Family Medicine

## 2020-12-24 VITALS — BP 165/81 | HR 83 | Ht 71.0 in | Wt 303.0 lb

## 2020-12-24 DIAGNOSIS — Z4802 Encounter for removal of sutures: Secondary | ICD-10-CM

## 2020-12-24 DIAGNOSIS — S0101XD Laceration without foreign body of scalp, subsequent encounter: Secondary | ICD-10-CM

## 2020-12-24 DIAGNOSIS — S0101XA Laceration without foreign body of scalp, initial encounter: Secondary | ICD-10-CM | POA: Diagnosis not present

## 2020-12-24 DIAGNOSIS — H1132 Conjunctival hemorrhage, left eye: Secondary | ICD-10-CM | POA: Diagnosis not present
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
## 2020-12-24 NOTE — Patient Instructions (Signed)
Laceration and Incision Repair. Pfenninger and Fowler's Procedures for Primary Care (4th ed., pp. [phone]). [place], [place]: Elsevier. Retrieved from http://www.clinicalkey.com">  Laceration Care, Adult A laceration is a cut that may go through all layers of the skin and into the tissue that is right under the skin. Some lacerations heal on their own. Others need to be closed with stitches (sutures), staples, skin adhesive strips, or skin glue. Proper care of a laceration reduces the risk for infection, helps the laceration heal better, and mayprevent scarring. How to care for your laceration Wash your hands with soap and water before touching your wound or changing your bandage (dressing). If soap and water are not available, use hand sanitizer. Keep the wound clean and dry. If you were given a dressing, you should change it at least once a day, or as told by your health care provider. You should also change it if it becomes wetor dirty. If sutures or staples were used: Keep the wound completely dry for the first 24 hours, or as told by your health care provider. After that time, you may shower or bathe. However, make sure that the wound is not soaked in water until after the sutures or staples have been removed. Clean the wound once each day, or as told by your health care provider: Wash the wound with soap and water. Rinse the wound with water to remove all soap. Pat the wound dry with a clean towel. Do not rub the wound. After cleaning the wound, apply a thin layer of antibiotic ointment as told by your health care provider. This will help prevent infection and keep the dressing from sticking to the wound. Have the sutures or staples removed as told by your health care provider. If skin adhesive strips were used: Do not get the skin adhesive strips wet. You may shower or bathe, but be careful to keep the wound dry. If the wound gets wet, pat it dry with a clean towel. Do not rub the  wound. Skin adhesive strips fall off on their own. You may trim the strips as the wound heals. Do not remove skin adhesive strips that are still stuck to the wound. They will fall off in time. If skin glue was used: Try to keep the wound dry, but you may briefly wet it in the shower or bath. Do not soak the wound in water, such as by swimming. After you have showered or bathed, gently pat the wound dry with a clean towel. Do not rub the wound. Do not do any activities that will make you sweat heavily until the skin glue has fallen off on its own. Do not apply liquid, cream, or ointment medicine to the wound while the skin glue is in place. Using those may loosen the film before the wound has healed. If a dressing is placed over the wound, be careful not to apply tape directly over the skin glue. Doing that may cause the glue to be pulled off before the wound has healed. Do not pick at the glue. Skin glue usually remains in place for 5-10 days and then falls off the skin. General instructions  Take over-the-counter and prescription medicines only as told by your health care provider. If you were prescribed an antibiotic medicine or ointment, take or apply it as told by your health care provider. Do not stop using it even if your condition improves. Do not scratch or pick at the wound. Check your wound every day for signs of  infection. Watch for: Redness, swelling, or pain. Fluid, blood, or pus. Raise (elevate) the injured area above the level of your heart while you are sitting or lying down for the first 24-48 hours after the laceration is repaired. If directed, put ice on the affected area: Put ice in a plastic bag. Place a towel between your skin and the bag. Leave the ice on for 20 minutes, 2-3 times a day. Keep all follow-up visits as told by your health care provider. This is important.  Contact a health care provider if: You received a tetanus shot and you have swelling, severe pain,  redness, or bleeding at the injection site. You have a fever. A wound that was closed breaks open. You notice a bad smell coming from your wound or your dressing. You notice something coming out of the wound, such as wood or glass. Your pain is not controlled with medicine. You have increased redness, swelling, or pain at the site of your wound. You have fluid, blood, or pus coming from your wound. You need to change the dressing often due to fluid, blood, or pus that is draining from the wound. You develop a new rash. You develop numbness around the wound. Get help right away if: You develop severe swelling around the wound. Your pain suddenly increases and is severe. You develop painful lumps near the wound or on skin anywhere else on your body. You have a red streak going away from your wound. The wound is on your hand or foot and you cannot properly move a finger or toe. The wound is on your hand or foot, and you notice that your fingers or toes look pale or bluish. Summary A laceration is a cut that may go through all layers of the skin and into the tissue that is right under the skin. Some lacerations heal on their own. Others need to be closed with stitches (sutures), staples, skin adhesive strips, or skin glue. Proper care of a laceration reduces the risk of infection, helps the laceration heal better, and prevents scarring. This information is not intended to replace advice given to you by your health care provider. Make sure you discuss any questions you have with your healthcare provider. Document Revised: 07/13/2017 Document Reviewed: 06/04/2017 Elsevier Patient Education  2022 [employer].

## 2020-12-28 ENCOUNTER — Encounter: Payer: Self-pay | Admitting: Family Medicine

## 2020-12-28 ENCOUNTER — Other Ambulatory Visit: Payer: Self-pay

## 2020-12-28 ENCOUNTER — Ambulatory Visit (INDEPENDENT_AMBULATORY_CARE_PROVIDER_SITE_OTHER): Payer: Commercial Managed Care - PPO | Admitting: Family Medicine

## 2020-12-28 VITALS — BP 164/79 | HR 69 | Temp 98.3°F | Ht 71.0 in | Wt 298.8 lb

## 2020-12-28 DIAGNOSIS — I10 Essential (primary) hypertension: Secondary | ICD-10-CM | POA: Diagnosis not present

## 2020-12-28 DIAGNOSIS — B351 Tinea unguium: Secondary | ICD-10-CM

## 2020-12-28 LAB — CMP14+EGFR
ALT: 14 IU/L (ref 0–44)
AST: 12 IU/L (ref 0–40)
Albumin/Globulin Ratio: 1.6 (ref 1.2–2.2)
Albumin: 4.7 g/dL (ref 3.8–4.9)
Alkaline Phosphatase: 129 IU/L — ABNORMAL HIGH (ref 44–121)
BUN/Creatinine Ratio: 9 (ref 9–20)
BUN: 9 mg/dL (ref 6–24)
Bilirubin Total: 0.4 mg/dL (ref 0.0–1.2)
CO2: 22 mmol/L (ref 20–29)
Calcium: 10.1 mg/dL (ref 8.7–10.2)
Chloride: 104 mmol/L (ref 96–106)
Creatinine, Ser: 0.98 mg/dL (ref 0.76–1.27)
Globulin, Total: 2.9 g/dL (ref 1.5–4.5)
Glucose: 102 mg/dL — ABNORMAL HIGH (ref 65–99)
Potassium: 4.1 mmol/L (ref 3.5–5.2)
Sodium: 141 mmol/L (ref 134–144)
Total Protein: 7.6 g/dL (ref 6.0–8.5)
eGFR: 91 mL/min/{1.73_m2} (ref >59)

## 2020-12-28 LAB — LIPID PANEL
Chol/HDL Ratio: 3.9 ratio (ref 0.0–5.0)
Cholesterol, Total: 176 mg/dL (ref 100–199)
HDL: 45 mg/dL (ref >39)
LDL Chol Calc (NIH): 117 mg/dL — ABNORMAL HIGH (ref 0–99)
Triglycerides: 77 mg/dL (ref 0–149)
VLDL Cholesterol Cal: 14 mg/dL (ref 5–40)

## 2020-12-28 MED ORDER — HYDROCHLOROTHIAZIDE 25 MG PO TABS: 25.0000 mg | ORAL_TABLET | Freq: Every day | ORAL | 3 refills | Status: DC

## 2020-12-28 MED ORDER — VITAMIN D (ERGOCALCIFEROL) 1.25 MG (50000 UNIT) PO CAPS: 50000.0000 [IU] | ORAL_CAPSULE | ORAL | 3 refills | Status: DC

## 2020-12-28 MED ORDER — TERBINAFINE HCL 250 MG PO TABS: 250.0000 mg | ORAL_TABLET | Freq: Every day | ORAL | 2 refills | Status: DC

## 2020-12-28 NOTE — Progress Notes (Signed)
Subjective:  Patient ID: Jeffrey Stark, male    DOB: [birth year]  Age: 55 y.o. MRN: [id number]  CC: Medical Management of Chronic Issues   HPI Jeffrey Stark presents for  follow-up of hypertension. Patient has no history of headache chest pain or shortness of breath or recent cough. Patient also denies symptoms of TIA such as focal numbness or weakness. Patient denies side effects from medication. States taking it regularly. Got fired for threatening someone, but Jeffrey Stark denies it. Feels that caused his BP to be high today.   Jogging in place 30 minutes a day.    History Jeffrey Stark has a past medical history of DVT (deep venous thrombosis) ([place]) (2010), Gout, Hyperlipidemia, Hypertension, and Pulmonary emboli ([place]) (2010).   Jeffrey Stark has a past surgical history that includes Colonoscopy; Cyst excision; Colonoscopy with propofol (N/A, 08/15/2019); and polypectomy (08/15/2019).   His family history includes Cancer in his father; Leukemia in his mother.Jeffrey Stark reports that Jeffrey Stark has quit smoking. Jeffrey Stark quit smokeless tobacco use about 18 years ago. Jeffrey Stark reports current alcohol use. Jeffrey Stark reports that Jeffrey Stark does not use drugs.  Current Outpatient Medications on File Prior to Visit  Medication Sig Dispense Refill   amLODipine (NORVASC) 10 MG tablet Take 1 tablet (10 mg total) by mouth daily. (Needs to be seen before next refill) 90 tablet 3   atorvastatin (LIPITOR) 10 MG tablet TAKE 1 TABLET BY MOUTH ONCE DAILY . APPOINTMENT REQUIRED FOR FUTURE REFILLS 90 tablet 0   ibuprofen (ADVIL) 200 MG tablet Take 400 mg by mouth every 6 (six) hours as needed for moderate pain.      No current facility-administered medications on file prior to visit.    ROS Review of Systems  Constitutional:  Negative for fever.  Respiratory:  Negative for shortness of breath.   Cardiovascular:  Negative for chest pain.  Musculoskeletal:  Negative for arthralgias.  Skin:  Negative for rash.   Objective:  BP (!) 164/79   Pulse 69   Temp 98.3 F  (36.8 C)   Ht 5' 11"  (1.803 m)   Wt 298 lb 12.8 oz (135.5 kg)   SpO2 96%   BMI 41.67 kg/m   BP Readings from Last 3 Encounters:  12/28/20 (!) 164/79  12/24/20 (!) 165/81  09/08/20 137/84    Wt Readings from Last 3 Encounters:  12/28/20 298 lb 12.8 oz (135.5 kg)  12/24/20 (!) 303 lb (137.4 kg)  09/08/20 (!) 310 lb 6.4 oz (140.8 kg)     Physical Exam Vitals reviewed.  Constitutional:      Appearance: Jeffrey Stark is well-developed.  HENT:     Head: Normocephalic and atraumatic.     Right Ear: External ear normal.     Left Ear: External ear normal.     Mouth/Throat:     Pharynx: No oropharyngeal exudate or posterior oropharyngeal erythema.  Eyes:     Pupils: Pupils are equal, round, and reactive to light.  Cardiovascular:     Rate and Rhythm: Normal rate and regular rhythm.     Heart sounds: No murmur heard. Pulmonary:     Effort: No respiratory distress.     Breath sounds: Normal breath sounds.  Musculoskeletal:     Cervical back: Normal range of motion and neck supple.  Neurological:     Mental Status: Jeffrey Stark is alert and oriented to person, place, and time.      Assessment & Plan:   Jeffrey Stark was seen today for medical management of chronic issues.  Diagnoses and all orders for this visit:  Primary hypertension -     CMP14+EGFR -     Lipid panel  Morbid obesity (HCC) -     CMP14+EGFR -     Lipid panel  Onychomycosis -     terbinafine (LAMISIL) 250 MG tablet; Take 1 tablet (250 mg total) by mouth daily.  Other orders -     Vitamin D, Ergocalciferol, (DRISDOL) 1.25 MG (50000 UNIT) CAPS capsule; Take 1 capsule (50,000 Units total) by mouth every 7 (seven) days. -     hydrochlorothiazide (HYDRODIURIL) 25 MG tablet; Take 1 tablet (25 mg total) by mouth daily. For blood pressure  Allergies as of 12/28/2020   No Known Allergies      Medication List        Accurate as of December 28, 2020  5:08 PM. If you have any questions, ask your nurse or doctor.           amLODipine 10 MG tablet Commonly known as: NORVASC Take 1 tablet (10 mg total) by mouth daily. (Needs to be seen before next refill)   atorvastatin 10 MG tablet Commonly known as: LIPITOR TAKE 1 TABLET BY MOUTH ONCE DAILY . APPOINTMENT REQUIRED FOR FUTURE REFILLS   hydrochlorothiazide 25 MG tablet Commonly known as: HYDRODIURIL Take 1 tablet (25 mg total) by mouth daily. For blood pressure Started by: Claretta Fraise, MD   ibuprofen 200 MG tablet Commonly known as: ADVIL Take 400 mg by mouth every 6 (six) hours as needed for moderate pain.   terbinafine 250 MG tablet Commonly known as: LamISIL Take 1 tablet (250 mg total) by mouth daily.   Vitamin D (Ergocalciferol) 1.25 MG (50000 UNIT) Caps capsule Commonly known as: DRISDOL Take 1 capsule (50,000 Units total) by mouth every 7 (seven) days.        Meds ordered this encounter  Medications   Vitamin D, Ergocalciferol, (DRISDOL) 1.25 MG (50000 UNIT) CAPS capsule    Sig: Take 1 capsule (50,000 Units total) by mouth every 7 (seven) days.    Dispense:  13 capsule    Refill:  3   hydrochlorothiazide (HYDRODIURIL) 25 MG tablet    Sig: Take 1 tablet (25 mg total) by mouth daily. For blood pressure    Dispense:  90 tablet    Refill:  3   terbinafine (LAMISIL) 250 MG tablet    Sig: Take 1 tablet (250 mg total) by mouth daily.    Dispense:  30 tablet    Refill:  2       Follow-up: Return in about 3 months (around 03/30/2021).  Claretta Fraise, M.D.

## 2021-04-04 ENCOUNTER — Ambulatory Visit (INDEPENDENT_AMBULATORY_CARE_PROVIDER_SITE_OTHER): Payer: Self-pay | Admitting: Family Medicine

## 2021-04-04 ENCOUNTER — Ambulatory Visit: Payer: Commercial Managed Care - PPO | Admitting: Family Medicine

## 2021-04-04 ENCOUNTER — Encounter: Payer: Self-pay | Admitting: Family Medicine

## 2021-04-04 ENCOUNTER — Other Ambulatory Visit: Payer: Self-pay

## 2021-04-04 VITALS — BP 136/74 | HR 83 | Temp 98.0°F | Ht 71.0 in | Wt 302.2 lb

## 2021-04-04 DIAGNOSIS — I10 Essential (primary) hypertension: Secondary | ICD-10-CM

## 2021-04-04 DIAGNOSIS — E785 Hyperlipidemia, unspecified: Secondary | ICD-10-CM

## 2021-04-04 MED ORDER — ATORVASTATIN CALCIUM 10 MG PO TABS: ORAL_TABLET | ORAL | 3 refills | Status: DC

## 2021-04-04 NOTE — Progress Notes (Signed)
Subjective:  Patient ID: Jeffrey Stark, male    DOB: March 25, 1966  Age: 55 y.o. MRN: [id number]  CC: Medical Management of Chronic Issues   HPI Jeffrey Stark presents for  follow-up of hypertension. Patient has no history of headache chest pain or shortness of breath or recent cough. Patient also denies symptoms of TIA such as focal numbness or weakness. Patient denies side effects from medication. States taking it regularly.  Not getting to walk for exercise due to 12 hour work schedule. Cooks for himself then out of time.     History Jeffrey Stark has a past medical history of DVT (deep venous thrombosis) ([place]) (2010), Gout, Hyperlipidemia, Hypertension, and Pulmonary emboli ([place]) (2010).   He has a past surgical history that includes Colonoscopy; Cyst excision; Colonoscopy with propofol (N/A, 08/15/2019); and polypectomy (08/15/2019).   His family history includes Cancer in his father; Leukemia in his mother.He reports that he has quit smoking. He quit smokeless tobacco use about 18 years ago. He reports current alcohol use. He reports that he does not use drugs.  Current Outpatient Medications on File Prior to Visit  Medication Sig Dispense Refill   amLODipine (NORVASC) 10 MG tablet Take 1 tablet (10 mg total) by mouth daily. (Needs to be seen before next refill) 90 tablet 3   hydrochlorothiazide (HYDRODIURIL) 25 MG tablet Take 1 tablet (25 mg total) by mouth daily. For blood pressure 90 tablet 3   ibuprofen (ADVIL) 200 MG tablet Take 400 mg by mouth every 6 (six) hours as needed for moderate pain.      terbinafine (LAMISIL) 250 MG tablet Take 1 tablet (250 mg total) by mouth daily. 30 tablet 2   Vitamin D, Ergocalciferol, (DRISDOL) 1.25 MG (50000 UNIT) CAPS capsule Take 1 capsule (50,000 Units total) by mouth every 7 (seven) days. 13 capsule 3   No current facility-administered medications on file prior to visit.    ROS Review of Systems  Constitutional:  Negative for fever.   Respiratory:  Negative for shortness of breath.   Cardiovascular:  Negative for chest pain.  Musculoskeletal:  Negative for arthralgias.  Skin:  Negative for rash.   Objective:  BP 136/74   Pulse 83   Temp 98 F (36.7 C)   Ht 5\' 11"  (1.803 m)   Wt (!) 302 lb 3.2 oz (137.1 kg)   SpO2 96%   BMI 42.15 kg/m   BP Readings from Last 3 Encounters:  04/04/21 136/74  12/28/20 (!) 164/79  12/24/20 (!) 165/81    Wt Readings from Last 3 Encounters:  04/04/21 (!) 302 lb 3.2 oz (137.1 kg)  12/28/20 298 lb 12.8 oz (135.5 kg)  12/24/20 (!) 303 lb (137.4 kg)     Physical Exam Vitals reviewed.  Constitutional:      General: He is not in acute distress.    Appearance: He is well-developed. He is obese. He is not ill-appearing.  HENT:     Head: Normocephalic.  Eyes:     Pupils: Pupils are equal, round, and reactive to light.  Cardiovascular:     Rate and Rhythm: Normal rate and regular rhythm.     Heart sounds: Normal heart sounds. No murmur heard. Pulmonary:     Effort: Pulmonary effort is normal.     Breath sounds: Normal breath sounds.  Abdominal:     Tenderness: There is no abdominal tenderness.  Musculoskeletal:        General: No tenderness.     Cervical back: Normal  range of motion.  Skin:    General: Skin is warm and dry.  Neurological:     Mental Status: He is alert and oriented to person, place, and time.     Deep Tendon Reflexes: Reflexes are normal and symmetric.  Psychiatric:        Behavior: Behavior normal.        Thought Content: Thought content normal.      Assessment & Plan:   There are no diagnoses linked to this encounter. Allergies as of 04/04/2021   No Known Allergies      Medication List        Accurate as of April 04, 2021  4:07 PM. If you have any questions, ask your nurse or doctor.          amLODipine 10 MG tablet Commonly known as: NORVASC Take 1 tablet (10 mg total) by mouth daily. (Needs to be seen before next refill)    atorvastatin 10 MG tablet Commonly known as: LIPITOR TAKE 1 TABLET BY MOUTH ONCE DAILY . APPOINTMENT REQUIRED FOR FUTURE REFILLS   hydrochlorothiazide 25 MG tablet Commonly known as: HYDRODIURIL Take 1 tablet (25 mg total) by mouth daily. For blood pressure   ibuprofen 200 MG tablet Commonly known as: ADVIL Take 400 mg by mouth every 6 (six) hours as needed for moderate pain.   terbinafine 250 MG tablet Commonly known as: LamISIL Take 1 tablet (250 mg total) by mouth daily.   Vitamin D (Ergocalciferol) 1.25 MG (50000 UNIT) Caps capsule Commonly known as: DRISDOL Take 1 capsule (50,000 Units total) by mouth every 7 (seven) days.        Meds ordered this encounter  Medications   atorvastatin (LIPITOR) 10 MG tablet    Sig: TAKE 1 TABLET BY MOUTH ONCE DAILY . APPOINTMENT REQUIRED FOR FUTURE REFILLS    Dispense:  90 tablet    Refill:  3    Diet and weight loss reviewed.  Follow-up: Return in about 6 months (around 10/02/2021) for [facility] physical.  Claretta Fraise, M.D.

## 2021-05-03 ENCOUNTER — Ambulatory Visit: Payer: Self-pay | Admitting: Family Medicine

## 2021-06-15 ENCOUNTER — Other Ambulatory Visit: Payer: Self-pay | Admitting: Family Medicine

## 2021-06-15 DIAGNOSIS — B351 Tinea unguium: Secondary | ICD-10-CM

## 2021-07-04 ENCOUNTER — Telehealth: Payer: Self-pay | Admitting: Family Medicine

## 2021-07-04 NOTE — Telephone Encounter (Signed)
°  Prescription Request  07/04/2021  Is this a "Controlled Substance" medicine? no  Have you seen your PCP in the last 2 weeks? no  If YES, route message to pool  -  If NO, patient needs to be scheduled for appointment.  What is the name of the medication or equipment? atorvastatin (LIPITOR) 10 MG tablet  Have you contacted your pharmacy to request a refill? Yes   Which pharmacy would you like this sent to? [facility] in [place]    Patient notified that their request is being sent to the clinical staff for review and that they should receive a response within 2 business days.

## 2021-07-04 NOTE — Telephone Encounter (Signed)
NA/NVM Atorvastatin sent to [facility] on 04/04/21 #90 with 3 refills

## 2021-07-05 NOTE — Telephone Encounter (Signed)
Second attempt. NA/NVM see previous note that Atorvastatin has RFs at [facility], pt may need to ask for it by name instead of going through the automated refill line sometimes when we send new refills they get assigned new prescription numbers Closing encounter

## 2021-07-26 ENCOUNTER — Other Ambulatory Visit: Payer: Self-pay | Admitting: Family Medicine

## 2021-07-26 DIAGNOSIS — B351 Tinea unguium: Secondary | ICD-10-CM

## 2021-09-22 ENCOUNTER — Other Ambulatory Visit: Payer: Self-pay | Admitting: Family Medicine

## 2021-09-22 DIAGNOSIS — B351 Tinea unguium: Secondary | ICD-10-CM

## 2021-10-03 ENCOUNTER — Encounter: Payer: Self-pay | Admitting: Family Medicine

## 2021-10-24 ENCOUNTER — Other Ambulatory Visit: Payer: Self-pay | Admitting: Family Medicine

## 2021-10-24 DIAGNOSIS — I10 Essential (primary) hypertension: Secondary | ICD-10-CM

## 2021-10-27 ENCOUNTER — Ambulatory Visit (INDEPENDENT_AMBULATORY_CARE_PROVIDER_SITE_OTHER): Payer: BC Managed Care – PPO | Admitting: Family Medicine

## 2021-10-27 ENCOUNTER — Encounter: Payer: Self-pay | Admitting: Family Medicine

## 2021-10-27 VITALS — BP 127/72 | HR 77 | Temp 98.1°F | Ht 71.0 in | Wt 293.8 lb

## 2021-10-27 DIAGNOSIS — Z0001 Encounter for general adult medical examination with abnormal findings: Secondary | ICD-10-CM | POA: Diagnosis not present

## 2021-10-27 DIAGNOSIS — Z0000 Encounter for general adult medical examination without abnormal findings: Secondary | ICD-10-CM | POA: Diagnosis not present

## 2021-10-27 DIAGNOSIS — E785 Hyperlipidemia, unspecified: Secondary | ICD-10-CM | POA: Diagnosis not present

## 2021-10-27 DIAGNOSIS — Z125 Encounter for screening for malignant neoplasm of prostate: Secondary | ICD-10-CM | POA: Diagnosis not present

## 2021-10-27 DIAGNOSIS — I10 Essential (primary) hypertension: Secondary | ICD-10-CM

## 2021-10-27 DIAGNOSIS — R7989 Other specified abnormal findings of blood chemistry: Secondary | ICD-10-CM

## 2021-10-27 DIAGNOSIS — B351 Tinea unguium: Secondary | ICD-10-CM

## 2021-10-27 MED ORDER — ATORVASTATIN CALCIUM 10 MG PO TABS
ORAL_TABLET | ORAL | 3 refills | Status: DC
Start: 2021-10-27 — End: 2022-12-06

## 2021-10-27 MED ORDER — AMLODIPINE BESYLATE 10 MG PO TABS: ORAL_TABLET | ORAL | 3 refills | Status: DC

## 2021-10-27 MED ORDER — TERBINAFINE HCL 250 MG PO TABS: 250.0000 mg | ORAL_TABLET | Freq: Every day | ORAL | 0 refills | Status: DC

## 2021-10-27 MED ORDER — VITAMIN D (ERGOCALCIFEROL) 1.25 MG (50000 UNIT) PO CAPS: 50000.0000 [IU] | ORAL_CAPSULE | ORAL | 3 refills | Status: DC

## 2021-10-27 MED ORDER — HYDROCHLOROTHIAZIDE 25 MG PO TABS: 25.0000 mg | ORAL_TABLET | Freq: Every day | ORAL | 3 refills | Status: DC

## 2021-10-27 NOTE — Progress Notes (Signed)
Annual physical  Subjective:  Patient ID: Jeffrey Stark, male    DOB: [birth year]  Age: 56 y.o. MRN: [id number]  CC: Annual Exam   HPI Jeffrey Stark presents for Annual physical.   presents for  follow-up of hypertension. Patient has no history of headache chest pain or shortness of breath or recent cough. Patient also denies symptoms of TIA such as focal numbness or weakness. Patient denies side effects from medication. States taking it regularly.  in for follow-up of elevated cholesterol. Doing well without complaints on current medication. Denies side effects of statin including myalgia and arthralgia and nausea. Currently no chest pain, shortness of breath or other cardiovascular related symptoms noted. Off med due to problem at pharmacy - since last office visit     10/27/2021    3:49 PM 04/04/2021    3:46 PM 12/28/2020   10:09 AM  Depression screen PHQ 2/9  Decreased Interest 0 0 0  Down, Depressed, Hopeless 0 0 0  PHQ - 2 Score 0 0 0    History Jeffrey Stark has a past medical history of DVT (deep venous thrombosis) ([place]) (2010), Gout, Hyperlipidemia, Hypertension, and Pulmonary emboli ([place]) (2010).   He has a past surgical history that includes Colonoscopy; Cyst excision; Colonoscopy with propofol (N/A, 08/15/2019); and polypectomy (08/15/2019).   His family history includes Cancer in his father; Leukemia in his mother.He reports that he has quit smoking. He quit smokeless tobacco use about 19 years ago. He reports current alcohol use. He reports that he does not use drugs.    ROS Review of Systems  Constitutional:  Negative for activity change, fatigue and unexpected weight change.  HENT:  Negative for congestion, ear pain, hearing loss, postnasal drip and trouble swallowing.   Eyes:  Negative for pain and visual disturbance.  Respiratory:  Negative for cough, chest tightness and shortness of breath.   Cardiovascular:  Negative for chest pain, palpitations and leg swelling.   Gastrointestinal:  Negative for abdominal distention, abdominal pain, blood in stool, constipation, diarrhea, nausea and vomiting.  Endocrine: Negative for cold intolerance, heat intolerance and polydipsia.  Genitourinary:  Negative for difficulty urinating, dysuria, flank pain, frequency and urgency.  Musculoskeletal:  Negative for arthralgias and joint swelling.  Skin:  Negative for color change, rash and wound.  Neurological:  Negative for dizziness, syncope, speech difficulty, weakness, light-headedness, numbness and headaches.  Hematological:  Does not bruise/bleed easily.  Psychiatric/Behavioral:  Negative for confusion, decreased concentration, dysphoric mood and sleep disturbance. The patient is not nervous/anxious.    Objective:  BP 127/72   Pulse 77   Temp 98.1 F (36.7 C)   Ht 5' 11"  (1.803 m)   Wt 293 lb 12.8 oz (133.3 kg)   SpO2 97%   BMI 40.98 kg/m   BP Readings from Last 3 Encounters:  10/27/21 127/72  04/04/21 136/74  12/28/20 (!) 164/79    Wt Readings from Last 3 Encounters:  10/27/21 293 lb 12.8 oz (133.3 kg)  04/04/21 (!) 302 lb 3.2 oz (137.1 kg)  12/28/20 298 lb 12.8 oz (135.5 kg)     Physical Exam Constitutional:      Appearance: He is well-developed.  HENT:     Head: Normocephalic and atraumatic.  Eyes:     Pupils: Pupils are equal, round, and reactive to light.  Neck:     Thyroid: No thyromegaly.     Trachea: No tracheal deviation.  Cardiovascular:     Rate and Rhythm: Normal rate and regular rhythm.  Heart sounds: Normal heart sounds. No murmur heard.   No friction rub. No gallop.  Pulmonary:     Breath sounds: Normal breath sounds. No wheezing or rales.  Abdominal:     General: Bowel sounds are normal. There is no distension.     Palpations: Abdomen is soft. There is no mass.     Tenderness: There is no abdominal tenderness.     Hernia: There is no hernia in the left inguinal area.  Genitourinary:    Penis: Normal.      Testes:  Normal.  Musculoskeletal:        General: Normal range of motion.     Cervical back: Normal range of motion.  Lymphadenopathy:     Cervical: No cervical adenopathy.  Skin:    General: Skin is warm and dry.  Neurological:     Mental Status: He is alert and oriented to person, place, and time.      Assessment & Plan:   Jeffrey Stark was seen today for annual exam.  Diagnoses and all orders for this visit:  Well adult exam -     CBC with Differential/Platelet -     CMP14+EGFR -     Lipid panel -     PSA, total and free -     VITAMIN D 25 Hydroxy (Vit-D Deficiency, Fractures)  Hyperlipidemia LDL goal <130 -     Lipid panel  Primary hypertension -     CBC with Differential/Platelet -     CMP14+EGFR -     amLODipine (NORVASC) 10 MG tablet; TAKE 1 TABLET BY MOUTH ONCE DAILY . APPOINTMENT REQUIRED FOR FUTURE REFILLS  Low serum vitamin D -     VITAMIN D 25 Hydroxy (Vit-D Deficiency, Fractures)  Prostate cancer screening -     PSA, total and free  Onychomycosis -     terbinafine (LAMISIL) 250 MG tablet; Take 1 tablet (250 mg total) by mouth daily.  Other orders -     hydrochlorothiazide (HYDRODIURIL) 25 MG tablet; Take 1 tablet (25 mg total) by mouth daily. For blood pressure -     Vitamin D, Ergocalciferol, (DRISDOL) 1.25 MG (50000 UNIT) CAPS capsule; Take 1 capsule (50,000 Units total) by mouth every 7 (seven) days. -     atorvastatin (LIPITOR) 10 MG tablet; TAKE 1 TABLET BY MOUTH ONCE DAILY       I have changed Jeffrey Stark "[name]"'s atorvastatin and terbinafine. I am also having him maintain his ibuprofen, amLODipine, hydrochlorothiazide, and Vitamin D (Ergocalciferol).  Allergies as of 10/27/2021   No Known Allergies      Medication List        Accurate as of October 27, 2021  4:25 PM. If you have any questions, ask your nurse or doctor.          amLODipine 10 MG tablet Commonly known as: NORVASC TAKE 1 TABLET BY MOUTH ONCE DAILY . APPOINTMENT REQUIRED FOR  FUTURE REFILLS   atorvastatin 10 MG tablet Commonly known as: LIPITOR TAKE 1 TABLET BY MOUTH ONCE DAILY What changed: additional instructions Changed by: Claretta Fraise, MD   hydrochlorothiazide 25 MG tablet Commonly known as: HYDRODIURIL Take 1 tablet (25 mg total) by mouth daily. For blood pressure   ibuprofen 200 MG tablet Commonly known as: ADVIL Take 400 mg by mouth every 6 (six) hours as needed for moderate pain.   terbinafine 250 MG tablet Commonly known as: LAMISIL Take 1 tablet (250 mg total) by mouth daily.   Vitamin  D (Ergocalciferol) 1.25 MG (50000 UNIT) Caps capsule Commonly known as: DRISDOL Take 1 capsule (50,000 Units total) by mouth every 7 (seven) days.         Follow-up: Return in about 6 months (around 04/28/2022).  Claretta Fraise, M.D.

## 2021-10-28 LAB — CBC WITH DIFFERENTIAL/PLATELET
Basophils Absolute: 0 10*3/uL (ref 0.0–0.2)
Basos: 1 %
EOS (ABSOLUTE): 0.3 10*3/uL (ref 0.0–0.4)
Eos: 3 %
Hematocrit: 41.2 % (ref 37.5–51.0)
Hemoglobin: 13.8 g/dL (ref 13.0–17.7)
Immature Grans (Abs): 0 10*3/uL (ref 0.0–0.1)
Immature Granulocytes: 0 %
Lymphocytes Absolute: 1.8 10*3/uL (ref 0.7–3.1)
Lymphs: 23 %
MCH: 28.1 pg (ref 26.6–33.0)
MCHC: 33.5 g/dL (ref 31.5–35.7)
MCV: 84 fL (ref 79–97)
Monocytes Absolute: 0.7 10*3/uL (ref 0.1–0.9)
Monocytes: 9 %
Neutrophils Absolute: 5 10*3/uL (ref 1.4–7.0)
Neutrophils: 64 %
Platelets: 309 10*3/uL (ref 150–450)
RBC: 4.91 x10E6/uL (ref 4.14–5.80)
RDW: 14.2 % (ref 11.6–15.4)
WBC: 7.9 10*3/uL (ref 3.4–10.8)

## 2021-10-28 LAB — LIPID PANEL
Chol/HDL Ratio: 4.7 ratio (ref 0.0–5.0)
Cholesterol, Total: 236 mg/dL — ABNORMAL HIGH (ref 100–199)
HDL: 50 mg/dL (ref >39)
LDL Chol Calc (NIH): 168 mg/dL — ABNORMAL HIGH (ref 0–99)
Triglycerides: 101 mg/dL (ref 0–149)
VLDL Cholesterol Cal: 18 mg/dL (ref 5–40)

## 2021-10-28 LAB — CMP14+EGFR
ALT: 18 IU/L (ref 0–44)
AST: 20 IU/L (ref 0–40)
Albumin/Globulin Ratio: 1.8 (ref 1.2–2.2)
Albumin: 4.3 g/dL (ref 3.8–4.9)
Alkaline Phosphatase: 114 IU/L (ref 44–121)
BUN/Creatinine Ratio: 19 (ref 9–20)
BUN: 18 mg/dL (ref 6–24)
Bilirubin Total: 0.3 mg/dL (ref 0.0–1.2)
CO2: 25 mmol/L (ref 20–29)
Calcium: 9.8 mg/dL (ref 8.7–10.2)
Chloride: 104 mmol/L (ref 96–106)
Creatinine, Ser: 0.95 mg/dL (ref 0.76–1.27)
Globulin, Total: 2.4 g/dL (ref 1.5–4.5)
Glucose: 100 mg/dL — ABNORMAL HIGH (ref 70–99)
Potassium: 3.7 mmol/L (ref 3.5–5.2)
Sodium: 143 mmol/L (ref 134–144)
Total Protein: 6.7 g/dL (ref 6.0–8.5)
eGFR: 93 mL/min/{1.73_m2} (ref >59)

## 2021-10-28 LAB — PSA, TOTAL AND FREE
PSA, Free Pct: 13.3 %
PSA, Free: 0.12 ng/mL
Prostate Specific Ag, Serum: 0.9 ng/mL (ref 0.0–4.0)

## 2021-10-28 LAB — VITAMIN D 25 HYDROXY (VIT D DEFICIENCY, FRACTURES): Vit D, 25-Hydroxy: 31.7 ng/mL (ref 30.0–100.0)

## 2021-11-02 ENCOUNTER — Encounter: Payer: Self-pay | Admitting: Emergency Medicine

## 2021-12-04 ENCOUNTER — Other Ambulatory Visit: Payer: Self-pay | Admitting: Family Medicine

## 2021-12-04 DIAGNOSIS — B351 Tinea unguium: Secondary | ICD-10-CM

## 2022-01-31 ENCOUNTER — Ambulatory Visit: Payer: BC Managed Care – PPO | Admitting: Family Medicine

## 2022-02-01 ENCOUNTER — Encounter: Payer: Self-pay | Admitting: Family Medicine

## 2022-04-05 ENCOUNTER — Other Ambulatory Visit: Payer: Self-pay | Admitting: Family Medicine

## 2022-04-05 DIAGNOSIS — B351 Tinea unguium: Secondary | ICD-10-CM

## 2022-04-05 NOTE — Telephone Encounter (Signed)
Last office visit 10/27/21 Last refill 10/27/21, #30, no refills

## 2022-05-10 DIAGNOSIS — R319 Hematuria, unspecified: Secondary | ICD-10-CM | POA: Diagnosis not present

## 2022-05-10 DIAGNOSIS — I10 Essential (primary) hypertension: Secondary | ICD-10-CM | POA: Diagnosis not present

## 2022-05-10 DIAGNOSIS — E876 Hypokalemia: Secondary | ICD-10-CM | POA: Diagnosis not present

## 2022-05-10 DIAGNOSIS — N3001 Acute cystitis with hematuria: Secondary | ICD-10-CM | POA: Diagnosis not present

## 2022-05-14 ENCOUNTER — Other Ambulatory Visit: Payer: Self-pay | Admitting: Family Medicine

## 2022-05-14 DIAGNOSIS — B351 Tinea unguium: Secondary | ICD-10-CM

## 2022-05-23 ENCOUNTER — Encounter: Payer: Self-pay | Admitting: Nurse Practitioner

## 2022-05-23 ENCOUNTER — Ambulatory Visit: Payer: BC Managed Care – PPO | Admitting: Nurse Practitioner

## 2022-05-23 VITALS — BP 154/76 | HR 81 | Temp 98.4°F | Ht 71.0 in | Wt 297.0 lb

## 2022-05-23 DIAGNOSIS — N3000 Acute cystitis without hematuria: Secondary | ICD-10-CM

## 2022-05-23 NOTE — Patient Instructions (Signed)
Urinary Tract Infection, Adult  A urinary tract infection (UTI) is an infection of any part of the urinary tract. The urinary tract includes the kidneys, ureters, bladder, and urethra. These organs make, store, and get rid of urine in the body. An upper UTI affects the ureters and kidneys. A lower UTI affects the bladder and urethra. What are the causes? Most urinary tract infections are caused by bacteria in your genital area around your urethra, where urine leaves your body. These bacteria grow and cause inflammation of your urinary tract. What increases the risk? You are more likely to develop this condition if: You have a urinary catheter that stays in place. You are not able to control when you urinate or have a bowel movement (incontinence). You are male and you: Use a spermicide or diaphragm for birth control. Have low estrogen levels. Are pregnant. You have certain genes that increase your risk. You are sexually active. You take antibiotic medicines. You have a condition that causes your flow of urine to slow down, such as: An enlarged prostate, if you are male. Blockage in your urethra. A kidney stone. A nerve condition that affects your bladder control (neurogenic bladder). Not getting enough to drink, or not urinating often. You have certain medical conditions, such as: Diabetes. A weak disease-fighting system (immunesystem). Sickle cell disease. Gout. Spinal cord injury. What are the signs or symptoms? Symptoms of this condition include: Needing to urinate right away (urgency). Frequent urination. This may include small amounts of urine each time you urinate. Pain or burning with urination. Blood in the urine. Urine that smells bad or unusual. Trouble urinating. Cloudy urine. Vaginal discharge, if you are male. Pain in the abdomen or the lower back. You may also have: Vomiting or a decreased appetite. Confusion. Irritability or tiredness. A fever or  chills. Diarrhea. The first symptom in older adults may be confusion. In some cases, they may not have any symptoms until the infection has worsened. How is this diagnosed? This condition is diagnosed based on your medical history and a physical exam. You may also have other tests, including: Urine tests. Blood tests. Tests for STIs (sexually transmitted infections). If you have had more than one UTI, a cystoscopy or imaging studies may be done to determine the cause of the infections. How is this treated? Treatment for this condition includes: Antibiotic medicine. Over-the-counter medicines to treat discomfort. Drinking enough water to stay hydrated. If you have frequent infections or have other conditions such as a kidney stone, you may need to see a health care provider who specializes in the urinary tract (urologist). In rare cases, urinary tract infections can cause sepsis. Sepsis is a life-threatening condition that occurs when the body responds to an infection. Sepsis is treated in the hospital with IV antibiotics, fluids, and other medicines. Follow these instructions at home:  Medicines Take over-the-counter and prescription medicines only as told by your health care provider. If you were prescribed an antibiotic medicine, take it as told by your health care provider. Do not stop using the antibiotic even if you start to feel better. General instructions Make sure you: Empty your bladder often and completely. Do not hold urine for long periods of time. Empty your bladder after sex. Wipe from front to back after urinating or having a bowel movement if you are male. Use each tissue only one time when you wipe. Drink enough fluid to keep your urine pale yellow. Keep all follow-up visits. This is important. Contact a health   care provider if: Your symptoms do not get better after 1-2 days. Your symptoms go away and then return. Get help right away if: You have severe pain in  your back or your lower abdomen. You have a fever or chills. You have nausea or vomiting. Summary A urinary tract infection (UTI) is an infection of any part of the urinary tract, which includes the kidneys, ureters, bladder, and urethra. Most urinary tract infections are caused by bacteria in your genital area. Treatment for this condition often includes antibiotic medicines. If you were prescribed an antibiotic medicine, take it as told by your health care provider. Do not stop using the antibiotic even if you start to feel better. Keep all follow-up visits. This is important. This information is not intended to replace advice given to you by your health care provider. Make sure you discuss any questions you have with your health care provider. Document Revised: 12/26/2019 Document Reviewed: 12/26/2019 Elsevier Patient Education  2023 [employer]  

## 2022-05-23 NOTE — Progress Notes (Signed)
Established Patient Office Visit  Subjective   Patient ID: Jeffrey Stark, male    DOB: [birth year]  Age: 56 y.o. MRN: [id number]  Chief Complaint  Patient presents with   Hospitalization Follow-up    Urinary Tract Infection  This is a new problem. The current episode started in the past 7 days. The problem occurs intermittently. The problem has been resolved. The patient is experiencing no pain. There has been no fever. Pertinent negatives include no chills, discharge, flank pain, frequency, hematuria, hesitancy, nausea or vomiting. He has tried antibiotics for the symptoms. The treatment provided significant relief.    Patient Active Problem List   Diagnosis Date Noted   Hyperuricemia 09/08/2020   Morbid obesity ([place]) 04/18/2019   Encounter for screening colonoscopy 04/18/2019   Hill Sachs deformity, right 02/05/2019   Knee pain, right 03/18/2015   Hyperlipidemia LDL goal <130 03/18/2015   Low serum vitamin D 03/18/2015   Hypertension 05/26/2013   Past Medical History:  Diagnosis Date   DVT (deep venous thrombosis) ([place]) 2010   developed DVT while in hospital waiting for cyst surgery   Gout    Hyperlipidemia    Hypertension    Pulmonary emboli ([place]) 2010   Past Surgical History:  Procedure Laterality Date   COLONOSCOPY     in his 44s, had polyps removed   COLONOSCOPY WITH PROPOFOL N/A 08/15/2019   Procedure: COLONOSCOPY WITH PROPOFOL;  Surgeon: Daneil Dolin, MD;  [facility]: [facility];  Service: Endoscopy;  Laterality: N/A;  12:45pm   CYST EXCISION     X3   POLYPECTOMY  08/15/2019   Procedure: POLYPECTOMY;  Surgeon: Daneil Dolin, MD;  Location: [facility];  Service: Endoscopy;;   Social History   Tobacco Use   Smoking status: Former   Smokeless tobacco: Former    Quit date: 05/29/2002  Substance Use Topics   Alcohol use: Yes    Comment: occasional   Drug use: No   Social History   Socioeconomic History   Marital status: Married    Spouse name: Not  on file   Number of children: Not on file   Years of education: Not on file   Highest education level: Not on file  Occupational History   Not on file  Tobacco Use   Smoking status: Former   Smokeless tobacco: Former    Quit date: 05/29/2002  Substance and Sexual Activity   Alcohol use: Yes    Comment: occasional   Drug use: No   Sexual activity: Not on file  Other Topics Concern   Not on file  Social History Narrative   Not on file   Social Determinants of Health   Financial Resource Strain: Not on file  Food Insecurity: Not on file  Transportation Needs: Not on file  Physical Activity: Not on file  Stress: Not on file  Social Connections: Not on file  Intimate Partner Violence: Not on file   Family Status  Relation Name Status   Mother  Deceased   Father  Deceased   Sister  Deceased   Brother  Alive   Daughter  Alive   Son  Alive   Brother  [place]   Brother  [place]   Sister  Deceased   Sister  Alive   Sister  Alive   Sister  Alive   Sister  Alive   Sister  Alive   Sister  Alive   Sister  Alive   Neg Hx  (Not  Specified)   Family History  Problem Relation Age of Onset   Leukemia Mother    Cancer Father        unknown primary   Colon cancer Neg Hx    No Known Allergies    Review of Systems  Constitutional: Negative.  Negative for chills.  HENT: Negative.    Respiratory: Negative.    Cardiovascular: Negative.   Gastrointestinal: Negative.  Negative for constipation, nausea and vomiting.  Genitourinary: Negative.  Negative for flank pain, frequency, hematuria and hesitancy.  Skin: Negative.  Negative for itching and rash.  Neurological: Negative.   Psychiatric/Behavioral: Negative.    All other systems reviewed and are negative.     Objective:     BP (!) 154/76   Pulse 81   Temp 98.4 F (36.9 C)   Ht _0  (1.803 m)   Wt 297 lb (134.7 kg)   SpO2 97%   BMI 41.42 kg/m  Wt Readings from Last 3 Encounters:  05/23/22 297 lb (134.7 kg)   10/27/21 293 lb 12.8 oz (133.3 kg)  04/04/21 (!) 302 lb 3.2 oz (137.1 kg)      Physical Exam Vitals and nursing note reviewed.  Constitutional:      Appearance: Normal appearance.  HENT:     Head: Normocephalic.     Right Ear: External ear normal.     Left Ear: External ear normal.     Nose: Nose normal.     Mouth/Throat:     Mouth: Mucous membranes are moist.     Pharynx: Oropharynx is clear.  Eyes:     Conjunctiva/sclera: Conjunctivae normal.  Cardiovascular:     Rate and Rhythm: Normal rate and regular rhythm.     Pulses: Normal pulses.     Heart sounds: Normal heart sounds.  Pulmonary:     Effort: Pulmonary effort is normal.     Breath sounds: Normal breath sounds.  Abdominal:     General: Bowel sounds are normal.  Skin:    General: Skin is warm.     Findings: No erythema or rash.  Neurological:     General: No focal deficit present.     Mental Status: He is alert.  Psychiatric:        Mood and Affect: Mood normal.        Behavior: Behavior normal.      No results found for any visits on 05/23/22.  Last CBC Lab Results  Component Value Date   WBC 7.9 10/27/2021   HGB 13.8 10/27/2021   HCT 41.2 10/27/2021   MCV 84 10/27/2021   MCH 28.1 10/27/2021   RDW 14.2 10/27/2021   PLT 309 41/66/0630   Last metabolic panel Lab Results  Component Value Date   GLUCOSE 100 (H) 10/27/2021   NA 143 10/27/2021   K 3.7 10/27/2021   CL 104 10/27/2021   CO2 25 10/27/2021   BUN 18 10/27/2021   CREATININE 0.95 10/27/2021   EGFR 93 10/27/2021   CALCIUM 9.8 10/27/2021   PROT 6.7 10/27/2021   ALBUMIN 4.3 10/27/2021   LABGLOB 2.4 10/27/2021   AGRATIO 1.8 10/27/2021   BILITOT 0.3 10/27/2021   ALKPHOS 114 10/27/2021   AST 20 10/27/2021   ALT 18 10/27/2021      The 10-year ASCVD risk score (Arnett DK, et al., 2019) is: 17.3%    Assessment & Plan:  Patient is following up after emergency room visit for urinary tract infection.  He was treated with cefdinir.  Patient reports that all symptoms have resolved and he has no new concerns.  Advised patient to follow-up as needed. Problem List Items Addressed This Visit   None Visit Diagnoses     Acute cystitis without hematuria    -  Primary       Return if symptoms worsen or fail to improve.    Ivy Lynn, NP

## 2022-05-25 ENCOUNTER — Other Ambulatory Visit: Payer: Self-pay | Admitting: Family Medicine

## 2022-05-25 DIAGNOSIS — B351 Tinea unguium: Secondary | ICD-10-CM

## 2022-05-25 NOTE — Telephone Encounter (Signed)
  Prescription Request  05/25/2022  Is this a "Controlled Substance" medicine? No   Have you seen your PCP in the last 2 weeks? 05/23/22  If YES, route message to pool  -  If NO, patient needs to be scheduled for appointment.  What is the name of the medication or equipment? terbinafine (LAMISIL) 250 MG tablet   Have you contacted your pharmacy to request a refill? yes   Which pharmacy would you like this sent to? [facility]   Patient notified that their request is being sent to the clinical staff for review and that they should receive a response within 2 business days.

## 2022-07-02 ENCOUNTER — Other Ambulatory Visit: Payer: Self-pay | Admitting: Family Medicine

## 2022-07-02 DIAGNOSIS — B351 Tinea unguium: Secondary | ICD-10-CM

## 2022-08-12 ENCOUNTER — Other Ambulatory Visit: Payer: Self-pay | Admitting: Family Medicine

## 2022-08-12 DIAGNOSIS — B351 Tinea unguium: Secondary | ICD-10-CM

## 2022-11-04 ENCOUNTER — Other Ambulatory Visit: Payer: Self-pay | Admitting: Family Medicine

## 2022-12-06 ENCOUNTER — Ambulatory Visit: Payer: BC Managed Care – PPO | Admitting: Family Medicine

## 2022-12-06 ENCOUNTER — Encounter: Payer: Self-pay | Admitting: Family Medicine

## 2022-12-06 VITALS — BP 141/71 | HR 75 | Temp 98.3°F | Ht 71.0 in | Wt 297.0 lb

## 2022-12-06 DIAGNOSIS — R7989 Other specified abnormal findings of blood chemistry: Secondary | ICD-10-CM | POA: Diagnosis not present

## 2022-12-06 DIAGNOSIS — I10 Essential (primary) hypertension: Secondary | ICD-10-CM | POA: Diagnosis not present

## 2022-12-06 DIAGNOSIS — Z125 Encounter for screening for malignant neoplasm of prostate: Secondary | ICD-10-CM | POA: Diagnosis not present

## 2022-12-06 DIAGNOSIS — E785 Hyperlipidemia, unspecified: Secondary | ICD-10-CM | POA: Diagnosis not present

## 2022-12-06 MED ORDER — AMLODIPINE BESYLATE 10 MG PO TABS
ORAL_TABLET | ORAL | 3 refills | Status: DC
Start: 2022-12-06 — End: 2023-12-12

## 2022-12-06 MED ORDER — HYDROCHLOROTHIAZIDE 25 MG PO TABS: 25.0000 mg | ORAL_TABLET | Freq: Every day | ORAL | 3 refills | Status: DC

## 2022-12-06 MED ORDER — VITAMIN D (ERGOCALCIFEROL) 1.25 MG (50000 UNIT) PO CAPS: 50000.0000 [IU] | ORAL_CAPSULE | ORAL | 0 refills | Status: DC

## 2022-12-06 MED ORDER — ATORVASTATIN CALCIUM 10 MG PO TABS: ORAL_TABLET | ORAL | 3 refills | Status: DC

## 2022-12-06 NOTE — Progress Notes (Signed)
Subjective:  Patient ID: Jeffrey Stark, male    DOB: [birth year]  Age: 57 y.o. MRN: [id number]  CC: Medical Management of Chronic Issues   HPI SALVATOR SEPPALA presents for  follow-up of hypertension. Patient has no history of headache chest pain or shortness of breath or recent cough. Patient also denies symptoms of TIA such as focal numbness or weakness. Patient denies side effects from medication. States taking it regularly.   History Ahad has a past medical history of DVT (deep venous thrombosis) ([facility]) (2010), Gout, Hyperlipidemia, Hypertension, and Pulmonary emboli ([facility]) (2010).   He has a past surgical history that includes Colonoscopy; Cyst excision; Colonoscopy with propofol (N/A, 08/15/2019); and polypectomy (08/15/2019).   His family history includes Cancer in his father; Leukemia in his mother.He reports that he has quit smoking. He quit smokeless tobacco use about 20 years ago. He reports current alcohol use. He reports that he does not use drugs.  Current Outpatient Medications on File Prior to Visit  Medication Sig Dispense Refill   ibuprofen (ADVIL) 200 MG tablet Take 400 mg by mouth every 6 (six) hours as needed for moderate pain.      terbinafine (LAMISIL) 250 MG tablet Take 1 tablet by mouth once daily 30 tablet 0   No current facility-administered medications on file prior to visit.    ROS Review of Systems  Constitutional: Negative.   HENT: Negative.    Eyes:  Negative for visual disturbance.  Respiratory:  Negative for cough and shortness of breath.   Cardiovascular:  Negative for chest pain and leg swelling.  Gastrointestinal:  Negative for abdominal pain, diarrhea, nausea and vomiting.  Genitourinary:  Negative for difficulty urinating.  Musculoskeletal:  Negative for arthralgias and myalgias.  Skin:  Negative for rash.  Neurological:  Negative for headaches.  Psychiatric/Behavioral:  Negative for sleep disturbance.     Objective:  BP (!) 141/71    Pulse 75   Temp 98.3 F (36.8 C)   Ht 5\' 11"  (1.803 m)   Wt 297 lb (134.7 kg)   SpO2 94%   BMI 41.42 kg/m   BP Readings from Last 3 Encounters:  12/06/22 (!) 141/71  05/23/22 (!) 154/76  10/27/21 127/72    Wt Readings from Last 3 Encounters:  12/06/22 297 lb (134.7 kg)  05/23/22 297 lb (134.7 kg)  10/27/21 293 lb 12.8 oz (133.3 kg)     Physical Exam Vitals reviewed.  Constitutional:      Appearance: He is well-developed. He is obese.  HENT:     Head: Normocephalic and atraumatic.     Right Ear: External ear normal.     Left Ear: External ear normal.     Mouth/Throat:     Pharynx: No oropharyngeal exudate or posterior oropharyngeal erythema.  Eyes:     Pupils: Pupils are equal, round, and reactive to light.  Cardiovascular:     Rate and Rhythm: Normal rate and regular rhythm.     Heart sounds: No murmur heard. Pulmonary:     Effort: No respiratory distress.     Breath sounds: Normal breath sounds.  Abdominal:     Palpations: There is no mass.     Tenderness: There is no abdominal tenderness.  Musculoskeletal:        General: No swelling. Normal range of motion.     Cervical back: Normal range of motion and neck supple.  Neurological:     Mental Status: He is alert and oriented to person, place, and  time.       Assessment & Plan:   Hashem was seen today for medical management of chronic issues.  Diagnoses and all orders for this visit:  Hyperlipidemia LDL goal <130 -     Lipid panel  Primary hypertension -     CBC with Differential/Platelet -     CMP14+EGFR -     amLODipine (NORVASC) 10 MG tablet; TAKE 1 TABLET BY MOUTH ONCE DAILY . APPOINTMENT REQUIRED FOR FUTURE REFILLS  Low serum vitamin D -     VITAMIN D 25 Hydroxy (Vit-D Deficiency, Fractures)  Prostate cancer screening -     PSA, total and free  Other orders -     atorvastatin (LIPITOR) 10 MG tablet; TAKE 1 TABLET BY MOUTH ONCE DAILY -     hydrochlorothiazide (HYDRODIURIL) 25 MG tablet;  Take 1 tablet (25 mg total) by mouth daily. For blood pressure -     Vitamin D, Ergocalciferol, (DRISDOL) 1.25 MG (50000 UNIT) CAPS capsule; Take 1 capsule (50,000 Units total) by mouth once a week.   Allergies as of 12/06/2022   No Known Allergies      Medication List        Accurate as of December 06, 2022  4:08 PM. If you have any questions, ask your nurse or doctor.          amLODipine 10 MG tablet Commonly known as: NORVASC TAKE 1 TABLET BY MOUTH ONCE DAILY . APPOINTMENT REQUIRED FOR FUTURE REFILLS   atorvastatin 10 MG tablet Commonly known as: LIPITOR TAKE 1 TABLET BY MOUTH ONCE DAILY   hydrochlorothiazide 25 MG tablet Commonly known as: HYDRODIURIL Take 1 tablet (25 mg total) by mouth daily. For blood pressure   ibuprofen 200 MG tablet Commonly known as: ADVIL Take 400 mg by mouth every 6 (six) hours as needed for moderate pain.   terbinafine 250 MG tablet Commonly known as: LAMISIL Take 1 tablet by mouth once daily   Vitamin D (Ergocalciferol) 1.25 MG (50000 UNIT) Caps capsule Commonly known as: DRISDOL Take 1 capsule (50,000 Units total) by mouth once a week.        Meds ordered this encounter  Medications   amLODipine (NORVASC) 10 MG tablet    Sig: TAKE 1 TABLET BY MOUTH ONCE DAILY . APPOINTMENT REQUIRED FOR FUTURE REFILLS    Dispense:  90 tablet    Refill:  3   atorvastatin (LIPITOR) 10 MG tablet    Sig: TAKE 1 TABLET BY MOUTH ONCE DAILY    Dispense:  90 tablet    Refill:  3   hydrochlorothiazide (HYDRODIURIL) 25 MG tablet    Sig: Take 1 tablet (25 mg total) by mouth daily. For blood pressure    Dispense:  90 tablet    Refill:  3   Vitamin D, Ergocalciferol, (DRISDOL) 1.25 MG (50000 UNIT) CAPS capsule    Sig: Take 1 capsule (50,000 Units total) by mouth once a week.    Dispense:  12 capsule    Refill:  0    Weight loss strategy reviewed  Follow-up: Return in about 6 months (around 06/08/2023) for hypertension, cholesterol.  Mechele Claude,  M.D.

## 2022-12-07 LAB — CBC WITH DIFFERENTIAL/PLATELET
Basophils Absolute: 0 10*3/uL (ref 0.0–0.2)
Basos: 1 %
EOS (ABSOLUTE): 0.1 10*3/uL (ref 0.0–0.4)
Eos: 2 %
Hematocrit: 36.6 % — ABNORMAL LOW (ref 37.5–51.0)
Hemoglobin: 12 g/dL — ABNORMAL LOW (ref 13.0–17.7)
Immature Grans (Abs): 0 10*3/uL (ref 0.0–0.1)
Immature Granulocytes: 0 %
Lymphocytes Absolute: 1.5 10*3/uL (ref 0.7–3.1)
Lymphs: 23 %
MCH: 27.6 pg (ref 26.6–33.0)
MCHC: 32.8 g/dL (ref 31.5–35.7)
MCV: 84 fL (ref 79–97)
Monocytes Absolute: 0.5 10*3/uL (ref 0.1–0.9)
Monocytes: 8 %
Neutrophils Absolute: 4.3 10*3/uL (ref 1.4–7.0)
Neutrophils: 66 %
Platelets: 317 10*3/uL (ref 150–450)
RBC: 4.34 x10E6/uL (ref 4.14–5.80)
RDW: 14.4 % (ref 11.6–15.4)
WBC: 6.6 10*3/uL (ref 3.4–10.8)

## 2022-12-07 LAB — PSA, TOTAL AND FREE
PSA, Free Pct: 16.7 %
PSA, Free: 0.1 ng/mL
Prostate Specific Ag, Serum: 0.6 ng/mL (ref 0.0–4.0)

## 2022-12-07 LAB — CMP14+EGFR
ALT: 14 IU/L (ref 0–44)
AST: 17 IU/L (ref 0–40)
Albumin: 4 g/dL (ref 3.8–4.9)
Alkaline Phosphatase: 107 IU/L (ref 44–121)
BUN/Creatinine Ratio: 13 (ref 9–20)
BUN: 14 mg/dL (ref 6–24)
Bilirubin Total: 0.2 mg/dL (ref 0.0–1.2)
CO2: 20 mmol/L (ref 20–29)
Calcium: 9.3 mg/dL (ref 8.7–10.2)
Chloride: 109 mmol/L — ABNORMAL HIGH (ref 96–106)
Creatinine, Ser: 1.04 mg/dL (ref 0.76–1.27)
Globulin, Total: 2.6 g/dL (ref 1.5–4.5)
Glucose: 88 mg/dL (ref 70–99)
Potassium: 4 mmol/L (ref 3.5–5.2)
Sodium: 143 mmol/L (ref 134–144)
Total Protein: 6.6 g/dL (ref 6.0–8.5)
eGFR: 83 mL/min/{1.73_m2} (ref >59)

## 2022-12-07 LAB — LIPID PANEL
Chol/HDL Ratio: 3.8 ratio (ref 0.0–5.0)
Cholesterol, Total: 179 mg/dL (ref 100–199)
HDL: 47 mg/dL (ref >39)
LDL Chol Calc (NIH): 121 mg/dL — ABNORMAL HIGH (ref 0–99)
Triglycerides: 59 mg/dL (ref 0–149)
VLDL Cholesterol Cal: 11 mg/dL (ref 5–40)

## 2022-12-07 LAB — VITAMIN D 25 HYDROXY (VIT D DEFICIENCY, FRACTURES): Vit D, 25-Hydroxy: 32.3 ng/mL (ref 30.0–100.0)

## 2022-12-07 NOTE — Progress Notes (Signed)
Hello Shaheim,  Your lab result is normal and/or stable.Some minor variations that are not significant are commonly marked abnormal, but do not represent any medical problem for you.  Best regards, Kemonie Cutillo, M.D.

## 2023-01-18 ENCOUNTER — Emergency Department (HOSPITAL_COMMUNITY)
Admission: EM | Admit: 2023-01-18 | Discharge: 2023-01-18 | Disposition: A | Discharge status: Home / Self Care | Payer: BC Managed Care – PPO | Attending: Emergency Medicine | Admitting: Emergency Medicine

## 2023-01-18 ENCOUNTER — Emergency Department (HOSPITAL_COMMUNITY): Payer: BC Managed Care – PPO

## 2023-01-18 ENCOUNTER — Other Ambulatory Visit: Payer: Self-pay

## 2023-01-18 ENCOUNTER — Encounter (HOSPITAL_COMMUNITY): Payer: Self-pay

## 2023-01-18 DIAGNOSIS — X501XXA Overexertion from prolonged static or awkward postures, initial encounter: Secondary | ICD-10-CM | POA: Diagnosis not present

## 2023-01-18 DIAGNOSIS — Z79899 Other long term (current) drug therapy: Secondary | ICD-10-CM | POA: Diagnosis not present

## 2023-01-18 DIAGNOSIS — M1712 Unilateral primary osteoarthritis, left knee: Secondary | ICD-10-CM | POA: Insufficient documentation

## 2023-01-18 DIAGNOSIS — M25562 Pain in left knee: Secondary | ICD-10-CM | POA: Diagnosis not present

## 2023-01-18 DIAGNOSIS — M85862 Other specified disorders of bone density and structure, left lower leg: Secondary | ICD-10-CM | POA: Diagnosis not present

## 2023-01-18 DIAGNOSIS — I10 Essential (primary) hypertension: Secondary | ICD-10-CM | POA: Diagnosis not present

## 2023-01-18 MED ORDER — TRAMADOL HCL 50 MG PO TABS: 50.0000 mg | ORAL_TABLET | Freq: Four times a day (QID) | ORAL | 0 refills | Status: DC | PRN

## 2023-01-18 NOTE — ED Provider Notes (Signed)
[facility] AT [facility] Provider Note   CSN: [id number] Arrival date & time: 01/18/23  1023     History  Chief Complaint  Patient presents with   Knee Pain    Jeffrey Stark is a 57 y.o. male with a history including hypertension, hyperlipidemia, gout and DVT presenting for evaluation of left knee pain.  He states having discomfort in both of his knees for a while, describing aching pain if he has been on his feet for long periods of time but usually better by the next day, then yesterday at work he describes stepping with his left foot and planting the foot and twisting after which he had significant increased pain swelling and difficulty weightbearing since that event.  He denies weak sensation in the knee, but has difficulty weightbearing secondary to pain.  There is no radiation of pain, he denies fevers or chills, increased warmth at the knee, he states this does not feel like a gout flare as he can touch his skin without discomfort.  Denies fevers or chills, no calf pain or tenderness.  His job requires standing for long periods of time.  He has employed rest, elevation and ibuprofen with equivocal improvement.  The history is provided by the patient.       Home Medications Prior to Admission medications   Medication Sig Start Date End Date Taking? Authorizing Provider  traMADol (ULTRAM) 50 MG tablet Take 1 tablet (50 mg total) by mouth every 6 (six) hours as needed. 01/18/23  Yes Milady Fleener, Raynelle Fanning, PA-C  amLODipine (NORVASC) 10 MG tablet TAKE 1 TABLET BY MOUTH ONCE DAILY . APPOINTMENT REQUIRED FOR FUTURE REFILLS 12/06/22   Mechele Claude, MD  atorvastatin (LIPITOR) 10 MG tablet TAKE 1 TABLET BY MOUTH ONCE DAILY 12/06/22   Mechele Claude, MD  hydrochlorothiazide (HYDRODIURIL) 25 MG tablet Take 1 tablet (25 mg total) by mouth daily. For blood pressure 12/06/22   Mechele Claude, MD  ibuprofen (ADVIL) 200 MG tablet Take 400 mg by mouth every 6 (six) hours as  needed for moderate pain.     [provider]  terbinafine (LAMISIL) 250 MG tablet Take 1 tablet by mouth once daily 04/05/22   Mechele Claude, MD  Vitamin D, Ergocalciferol, (DRISDOL) 1.25 MG (50000 UNIT) CAPS capsule Take 1 capsule (50,000 Units total) by mouth once a week. 12/06/22   Mechele Claude, MD      Allergies    Patient has no known allergies.    Review of Systems   Review of Systems  Constitutional:  Negative for fever.  Musculoskeletal:  Positive for arthralgias and joint swelling. Negative for myalgias.  Skin: Negative.   Neurological:  Negative for weakness and numbness.  All other systems reviewed and are negative.   Physical Exam Updated Vital Signs BP (!) 148/96 (BP Location: Left Arm)   Pulse 63   Temp 98 F (36.7 C) (Oral)   Resp 18   Ht 5\' 11"  (1.803 m)   Wt 134.7 kg   SpO2 97%   BMI 41.42 kg/m  Physical Exam Constitutional:      Appearance: He is well-developed.  HENT:     Head: Atraumatic.  Cardiovascular:     Comments: Pulses equal bilaterally Musculoskeletal:        General: Tenderness present.     Cervical back: Normal range of motion.     Left knee: Swelling and bony tenderness present. No deformity, effusion or crepitus. Decreased range of motion. No LCL laxity  or MCL laxity.Normal patellar mobility.     Comments: Patient is tender to palpation along his medial and lateral knee joint space without obvious palpable deformity.  There is some edema without a definitive ballotable effusion.,  There is some crepitus with range of motion.  He has difficulty flexing beyond 90 degrees secondary to fullness.  No calf pain or tenderness.  Skin:    General: Skin is warm and dry.  Neurological:     Mental Status: He is alert.     Sensory: No sensory deficit.     Motor: No weakness.     Deep Tendon Reflexes: Reflexes normal.     ED Results / Procedures / Treatments   Labs (all labs ordered are listed, but only abnormal results are  displayed) Labs Reviewed - No data to display  EKG None  Radiology DG Knee Left Port  Result Date: 01/18/2023 CLINICAL DATA:  Instability.  Pain EXAM: PORTABLE LEFT KNEE - 4 VIEW COMPARISON:  X-ray 09/17/2015 FINDINGS: Progressive degenerative changes of the knee. Moderate osteophytes of the patellofemoral joint with joint space loss. There is severe joint space loss of the medial compartment and moderate of the lateral compartment. Associated moderate osteophytes. Subchondral cyst formation identified along the tibial plateau in the medial femoral condyle. Osteopenia. No fracture or dislocation. No joint effusion on lateral view. IMPRESSION: Progressive tricompartmental degenerative changes greatest of the medial compartment Electronically Signed   By: Karen Kays M.D.   On: 01/18/2023 12:52    Procedures Procedures    Medications Ordered in ED Medications - No data to display  ED Course/ Medical Decision Making/ A&P                                 Medical Decision Making Patient presenting with acute on chronic left knee pain, after a planting and twisting type injury, concern for possible meniscal or ligament injury.  However his x-ray does suggest severe medial and moderate lateral degenerative changes.  He will will need specialized care with orthopedics which was discussed with him at length, he is being referred to Dr. Dallas Schimke for this.  He was offered a knee immobilizer, he states he has been at home from a prior injury which he may decide to use as needed.  We discussed other home treatments including elevation, discussed role of ice versus heat, activity as tolerated, discussed maximum safe dosing of ibuprofen, suggested also adding arthritis strength Tylenol and he was prescribed a small quantity of tramadol especially for nighttime use as he states his pain kept him from sleeping last night.  No exam findings to suggest recurrent DVT, no increased warmth at this joint, exam is  not consistent with gout.  Amount and/or Complexity of Data Reviewed Radiology: ordered and independent interpretation performed.    Details: Reviewed x-ray, discussed with patient, agree with interpretation.  Risk Prescription drug management.           Final Clinical Impression(s) / ED Diagnoses Final diagnoses:  Arthritis of left knee    Rx / DC Orders ED Discharge Orders          Ordered    traMADol (ULTRAM) 50 MG tablet  Every 6 hours PRN        01/18/23 1413              Victoriano Lain 01/20/23 1305    Terrilee Files, MD 01/22/23 [phone]

## 2023-01-18 NOTE — ED Notes (Signed)
Pt ambulated to the restroom with out difficulty.

## 2023-01-18 NOTE — Discharge Instructions (Addendum)
As discussed I do recommend continuing your ibuprofen but do not take more than 4 tablets for total of 800 mg every 8 hours maximum dosing.  Make sure you take this with the snack on your stomach.  Also recommend arthritis strength Tylenol per label instructions.  I have prescribed you tramadol if needed for nighttime pain relief.  This medication will make you drowsy, do not drive within 4 hours of taking it.  Ice is much as is comfortable.  You will need follow-up care with orthopedics, call Dr. Dallas Schimke for an office visit as discussed.

## 2023-01-18 NOTE — ED Triage Notes (Signed)
Pt states having pain with his knee for awhile and yesterday when stepping "the wrong way" pt noticed he could not put any pressure on it. Pt attempted to go into work today but is unable to put "much" pressure on it.

## 2023-01-30 ENCOUNTER — Telehealth: Payer: Self-pay

## 2023-01-30 NOTE — Telephone Encounter (Signed)
Transition Care Management Unsuccessful Follow-up Telephone Call  Date of discharge and from where:  Jeffrey [facility] 8/22  Attempts:  1st Attempt  Reason for unsuccessful TCM follow-up call:  No answer/busy   Jeffrey Stark [place]  [facility], [facility] Guide, Phone: [phone] Website: [email or web address]

## 2023-01-30 NOTE — Telephone Encounter (Signed)
Transition Care Management Unsuccessful Follow-up Telephone Call  Date of discharge and from where:  [facility]  Attempts:  2nd Attempt  Reason for unsuccessful TCM follow-up call:  No answer/busy   Lenard Forth [place]  [facility], [facility] Guide, Phone: [phone] Website: [email or web address]

## 2023-06-07 ENCOUNTER — Ambulatory Visit: Payer: BC Managed Care – PPO | Admitting: Family Medicine

## 2023-06-20 ENCOUNTER — Ambulatory Visit: Payer: BC Managed Care – PPO | Admitting: Family Medicine

## 2023-06-20 ENCOUNTER — Encounter: Payer: Self-pay | Admitting: Family Medicine

## 2023-06-20 VITALS — BP 115/69 | HR 75 | Ht 71.0 in | Wt 298.0 lb

## 2023-06-20 DIAGNOSIS — I10 Essential (primary) hypertension: Secondary | ICD-10-CM | POA: Diagnosis not present

## 2023-06-20 DIAGNOSIS — R202 Paresthesia of skin: Secondary | ICD-10-CM

## 2023-06-20 DIAGNOSIS — E785 Hyperlipidemia, unspecified: Secondary | ICD-10-CM | POA: Diagnosis not present

## 2023-06-20 LAB — LIPID PANEL

## 2023-06-20 MED ORDER — VITAMIN D (ERGOCALCIFEROL) 1.25 MG (50000 UNIT) PO CAPS: 50000.0000 [IU] | ORAL_CAPSULE | ORAL | 0 refills | Status: DC

## 2023-06-20 NOTE — Progress Notes (Signed)
Subjective:  Patient ID: Jeffrey Stark, male    DOB: 1965/11/06  Age: 58 y.o. MRN: [id number]  CC: sensation (Pins and needles sensation in left thing for almost two weeks. Burning pain. Sitting makes it go away. )   HPI Jeffrey Stark presents for  presents for  follow-up of hypertension. Patient has no history of headache chest pain or shortness of breath or recent cough. Patient also denies symptoms of TIA such as focal numbness or weakness. Patient denies side effects from medication. States taking it regularly.  Spot on lateral left thigh. Lasts 15 min. Occurring about 3 times a week. Feels like being pricked withneedles, burning.    in for follow-up of elevated cholesterol. Doing well without complaints on current medication. Denies side effects of statin including myalgia and arthralgia and nausea. Currently no chest pain, shortness of breath or other cardiovascular related symptoms noted.      06/20/2023    3:46 PM 12/06/2022    3:45 PM 05/23/2022    3:38 PM  Depression screen PHQ 2/9  Decreased Interest 0 0 0  Down, Depressed, Hopeless 0 0 0  PHQ - 2 Score 0 0 0  Altered sleeping 0  0  Tired, decreased energy 0  0  Change in appetite 0  0  Feeling bad or failure about yourself  0  0  Trouble concentrating 0  0  Moving slowly or fidgety/restless 0  0  Suicidal thoughts 0  0  PHQ-9 Score 0  0  Difficult doing work/chores Not difficult at all  Not difficult at all    History Devell has a past medical history of DVT (deep venous thrombosis) (HCC) (2010), Gout, Hyperlipidemia, Hypertension, and Pulmonary emboli (HCC) (2010).   He has a past surgical history that includes Colonoscopy; Cyst excision; Colonoscopy with propofol (N/A, 08/15/2019); and polypectomy (08/15/2019).   His family history includes Cancer in his father; Leukemia in his mother.He reports that he has quit smoking. He quit smokeless tobacco use about 21 years ago. He reports current alcohol use. He reports  that he does not use drugs.    ROS Review of Systems  Constitutional:  Negative for fever.  Respiratory:  Negative for shortness of breath.   Cardiovascular:  Negative for chest pain.  Musculoskeletal:  Negative for arthralgias.  Skin:  Negative for rash.  Neurological:  Positive for numbness.    Objective:  BP 115/69   Pulse 75   Ht 5\' 11"  (1.803 m)   Wt 298 lb (135.2 kg)   SpO2 97%   BMI 41.56 kg/m   BP Readings from Last 3 Encounters:  06/20/23 115/69  01/18/23 (!) 140/85  12/06/22 (!) 141/71    Wt Readings from Last 3 Encounters:  06/20/23 298 lb (135.2 kg)  01/18/23 297 lb (134.7 kg)  12/06/22 297 lb (134.7 kg)     Physical Exam Vitals reviewed.  Constitutional:      Appearance: He is well-developed.  HENT:     Head: Normocephalic and atraumatic.     Right Ear: External ear normal.     Left Ear: External ear normal.     Mouth/Throat:     Pharynx: No oropharyngeal exudate or posterior oropharyngeal erythema.  Eyes:     Pupils: Pupils are equal, round, and reactive to light.  Cardiovascular:     Rate and Rhythm: Normal rate and regular rhythm.     Heart sounds: No murmur heard. Pulmonary:     Effort: No respiratory  distress.     Breath sounds: Normal breath sounds.  Musculoskeletal:     Cervical back: Normal range of motion and neck supple.  Neurological:     Mental Status: He is alert and oriented to person, place, and time.       Assessment & Plan:   Jeffrey Stark" was seen today for sensation.  Diagnoses and all orders for this visit:  Hyperlipidemia LDL goal <130 -     CMP14+EGFR -     Lipid panel  Primary hypertension -     CBC with Differential/Platelet -     CMP14+EGFR -     Lipid panel -     Vitamin D, Ergocalciferol, (DRISDOL) 1.25 MG (50000 UNIT) CAPS capsule; Take 1 capsule (50,000 Units total) by mouth once a week.  Paresthesias       I have discontinued [name] "[name]"'s terbinafine and traMADol. I am also  having him maintain his ibuprofen, amLODipine, atorvastatin, hydrochlorothiazide, and Vitamin D (Ergocalciferol).  Allergies as of 06/20/2023   No Known Allergies      Medication List        Accurate as of June 20, 2023  8:58 PM. If you have any questions, ask your nurse or doctor.          STOP taking these medications    terbinafine 250 MG tablet Commonly known as: LAMISIL Stopped by: Anjelika Ausburn   traMADol 50 MG tablet Commonly known as: Insurance underwriter by: Climmie Buelow       TAKE these medications    amLODipine 10 MG tablet Commonly known as: NORVASC TAKE 1 TABLET BY MOUTH ONCE DAILY . APPOINTMENT REQUIRED FOR FUTURE REFILLS   atorvastatin 10 MG tablet Commonly known as: LIPITOR TAKE 1 TABLET BY MOUTH ONCE DAILY   hydrochlorothiazide 25 MG tablet Commonly known as: HYDRODIURIL Take 1 tablet (25 mg total) by mouth daily. For blood pressure   ibuprofen 200 MG tablet Commonly known as: ADVIL Take 400 mg by mouth every 6 (six) hours as needed for moderate pain.   Vitamin D (Ergocalciferol) 1.25 MG (50000 UNIT) Caps capsule Commonly known as: DRISDOL Take 1 capsule (50,000 Units total) by mouth once a week.         Follow-up: Return in about 6 months (around 12/18/2023).  Mechele Claude, M.D.

## 2023-06-21 ENCOUNTER — Other Ambulatory Visit: Payer: Self-pay | Admitting: Family Medicine

## 2023-06-21 LAB — CBC WITH DIFFERENTIAL/PLATELET
Basophils Absolute: 0 10*3/uL (ref 0.0–0.2)
Basos: 0 %
EOS (ABSOLUTE): 0.1 10*3/uL (ref 0.0–0.4)
Eos: 2 %
Hematocrit: 36.8 % — ABNORMAL LOW (ref 37.5–51.0)
Hemoglobin: 12.2 g/dL — ABNORMAL LOW (ref 13.0–17.7)
Immature Grans (Abs): 0 10*3/uL (ref 0.0–0.1)
Immature Granulocytes: 0 %
Lymphocytes Absolute: 2 10*3/uL (ref 0.7–3.1)
Lymphs: 29 %
MCH: 27.5 pg (ref 26.6–33.0)
MCHC: 33.2 g/dL (ref 31.5–35.7)
MCV: 83 fL (ref 79–97)
Monocytes Absolute: 0.7 10*3/uL (ref 0.1–0.9)
Monocytes: 10 %
Neutrophils Absolute: 4 10*3/uL (ref 1.4–7.0)
Neutrophils: 59 %
Platelets: 312 10*3/uL (ref 150–450)
RBC: 4.44 x10E6/uL (ref 4.14–5.80)
RDW: 13.7 % (ref 11.6–15.4)
WBC: 6.9 10*3/uL (ref 3.4–10.8)

## 2023-06-21 LAB — LIPID PANEL
Cholesterol, Total: 150 mg/dL (ref 100–199)
HDL: 40 mg/dL (ref >39)
LDL CALC COMMENT:: 3.8 ratio (ref 0.0–5.0)
LDL Chol Calc (NIH): 93 mg/dL (ref 0–99)
Triglycerides: 91 mg/dL (ref 0–149)
VLDL Cholesterol Cal: 17 mg/dL (ref 5–40)

## 2023-06-21 LAB — CMP14+EGFR
ALT: 10 IU/L (ref 0–44)
AST: 12 IU/L (ref 0–40)
Albumin: 4 g/dL (ref 3.8–4.9)
Alkaline Phosphatase: 114 IU/L (ref 44–121)
BUN/Creatinine Ratio: 12 (ref 9–20)
BUN: 12 mg/dL (ref 6–24)
Bilirubin Total: 0.2 mg/dL (ref 0.0–1.2)
CO2: 22 mmol/L (ref 20–29)
Calcium: 9.4 mg/dL (ref 8.7–10.2)
Chloride: 104 mmol/L (ref 96–106)
Creatinine, Ser: 1 mg/dL (ref 0.76–1.27)
Globulin, Total: 2.6 g/dL (ref 1.5–4.5)
Glucose: 87 mg/dL (ref 70–99)
Potassium: 3.1 mmol/L — ABNORMAL LOW (ref 3.5–5.2)
Sodium: 142 mmol/L (ref 134–144)
Total Protein: 6.6 g/dL (ref 6.0–8.5)
eGFR: 87 mL/min/{1.73_m2} (ref >59)

## 2023-06-21 MED ORDER — POTASSIUM CHLORIDE CRYS ER 20 MEQ PO TBCR: 20.0000 meq | EXTENDED_RELEASE_TABLET | Freq: Every day | ORAL | 5 refills | Status: DC

## 2023-10-18 ENCOUNTER — Other Ambulatory Visit: Payer: Self-pay | Admitting: Family Medicine

## 2023-10-18 DIAGNOSIS — I10 Essential (primary) hypertension: Secondary | ICD-10-CM

## 2023-12-12 ENCOUNTER — Other Ambulatory Visit: Payer: Self-pay | Admitting: Family Medicine

## 2023-12-12 DIAGNOSIS — I10 Essential (primary) hypertension: Secondary | ICD-10-CM

## 2023-12-18 ENCOUNTER — Ambulatory Visit: Payer: BC Managed Care – PPO | Admitting: Family Medicine

## 2023-12-18 ENCOUNTER — Encounter: Payer: Self-pay | Admitting: Family Medicine

## 2023-12-18 VITALS — BP 122/70 | HR 60 | Temp 98.3°F | Ht 71.0 in | Wt 263.0 lb

## 2023-12-18 DIAGNOSIS — I10 Essential (primary) hypertension: Secondary | ICD-10-CM | POA: Diagnosis not present

## 2023-12-18 DIAGNOSIS — R7989 Other specified abnormal findings of blood chemistry: Secondary | ICD-10-CM

## 2023-12-18 DIAGNOSIS — W57XXXA Bitten or stung by nonvenomous insect and other nonvenomous arthropods, initial encounter: Secondary | ICD-10-CM | POA: Diagnosis not present

## 2023-12-18 DIAGNOSIS — E785 Hyperlipidemia, unspecified: Secondary | ICD-10-CM

## 2023-12-18 DIAGNOSIS — E790 Hyperuricemia without signs of inflammatory arthritis and tophaceous disease: Secondary | ICD-10-CM | POA: Diagnosis not present

## 2023-12-18 MED ORDER — AMLODIPINE BESYLATE 10 MG PO TABS
10.0000 mg | ORAL_TABLET | Freq: Every day | ORAL | 0 refills | Status: AC
Start: 2023-12-18 — End: ?

## 2023-12-18 MED ORDER — DOXYCYCLINE HYCLATE 100 MG PO CAPS: 100.0000 mg | ORAL_CAPSULE | Freq: Two times a day (BID) | ORAL | 0 refills | Status: AC

## 2023-12-18 MED ORDER — POTASSIUM CHLORIDE CRYS ER 20 MEQ PO TBCR: 20.0000 meq | EXTENDED_RELEASE_TABLET | Freq: Every day | ORAL | 5 refills | Status: AC

## 2023-12-18 MED ORDER — HYDROCHLOROTHIAZIDE 25 MG PO TABS: 25.0000 mg | ORAL_TABLET | Freq: Every day | ORAL | 3 refills | Status: AC

## 2023-12-18 NOTE — Progress Notes (Addendum)
 Subjective:  Patient ID: Jeffrey Stark, male    DOB: 13-Jul-1965  Age: 58 y.o. MRN: [id number]  CC: Medical Management of Chronic Issues   HPI Jeffrey Stark presents for  follow-up of hypertension. Patient has no history of headache chest pain or shortness of breath or recent cough. Patient also denies symptoms of TIA such as focal numbness or weakness. Patient denies side effects from medication. States taking it regularly.   in for follow-up of elevated cholesterol. Doing well without complaints on current medication. Denies side effects of statin including myalgia and arthralgia and nausea. Currently no chest pain, shortness of breath or other cardiovascular related symptoms noted.  Used to get gout frequently. Started taking a tsp. Of vinegar in 1/2 cup of water and gout has been rare. Started 20 yearsTick attached for weeks at right posterior thigh. Jeffrey Stark off last week, engorged. No rash or fever.    History Jeffrey Stark has a past medical history of DVT (deep venous thrombosis) (HCC) (2010), Gout, Hyperlipidemia, Hypertension, and Pulmonary emboli (HCC) (2010).   He has a past surgical history that includes Colonoscopy; Cyst excision; Colonoscopy with propofol  (N/A, 08/15/2019); and polypectomy (08/15/2019).   His family history includes Cancer in his father; Leukemia in his mother.He reports that he has quit smoking. He quit smokeless tobacco use about 21 years ago. He reports current alcohol use. He reports that he does not use drugs.  Current Outpatient Medications on File Prior to Visit  Medication Sig Dispense Refill   atorvastatin  (LIPITOR) 10 MG tablet Take 1 tablet by mouth once daily 90 tablet 0   ibuprofen (ADVIL) 200 MG tablet Take 400 mg by mouth every 6 (six) hours as needed for moderate pain.      Vitamin D , Ergocalciferol , (DRISDOL ) 1.25 MG (50000 UNIT) CAPS capsule Take 1 capsule by mouth once a week 12 capsule 0   No current facility-administered medications on file prior  to visit.    ROS Review of Systems  Constitutional:  Negative for fever.  Respiratory:  Negative for shortness of breath.   Cardiovascular:  Negative for chest pain.  Musculoskeletal:  Negative for arthralgias.  Skin:  Negative for rash.    Objective:  BP 122/70   Pulse 60   Temp 98.3 F (36.8 C)   Ht 5' 11 (1.803 m)   Wt 263 lb (119.3 kg)   SpO2 98%   BMI 36.68 kg/m   BP Readings from Last 3 Encounters:  12/18/23 122/70  06/20/23 115/69  01/18/23 (!) 140/85    Wt Readings from Last 3 Encounters:  12/18/23 263 lb (119.3 kg)  06/20/23 298 lb (135.2 kg)  01/18/23 297 lb (134.7 kg)     Physical Exam Vitals reviewed.  Constitutional:      Appearance: He is well-developed.  HENT:     Head: Normocephalic and atraumatic.     Right Ear: External ear normal.     Left Ear: External ear normal.     Mouth/Throat:     Pharynx: No oropharyngeal exudate or posterior oropharyngeal erythema.  Eyes:     Pupils: Pupils are equal, round, and reactive to light.  Cardiovascular:     Rate and Rhythm: Normal rate and regular rhythm.     Heart sounds: No murmur heard. Pulmonary:     Effort: No respiratory distress.     Breath sounds: Normal breath sounds.  Musculoskeletal:     Cervical back: Normal range of motion and neck supple.  Neurological:  Mental Status: He is alert and oriented to person, place, and time.       Assessment & Plan:  Hyperlipidemia LDL goal <130 -     CBC with Differential/Platelet -     CMP14+EGFR -     Lipid panel  Primary hypertension -     amLODIPine  Besylate; Take 1 tablet (10 mg total) by mouth daily.  Dispense: 90 tablet; Refill: 0 -     CBC with Differential/Platelet -     CMP14+EGFR  Low serum vitamin D  -     CBC with Differential/Platelet -     CMP14+EGFR -     VITAMIN D  25 Hydroxy (Vit-D Deficiency, Fractures)  Hyperuricemia -     CBC with Differential/Platelet -     CMP14+EGFR -     Uric acid  Tick bite, initial  encounter  Other orders -     Potassium Chloride  Crys ER; Take 1 tablet (20 mEq total) by mouth daily. For potassium replacement/ supplement  Dispense: 30 tablet; Refill: 5 -     hydroCHLOROthiazide ; Take 1 tablet (25 mg total) by mouth daily. For blood pressure  Dispense: 90 tablet; Refill: 3 -     Doxycycline  Hyclate; Take 1 capsule (100 mg total) by mouth 2 (two) times daily.  Dispense: 20 capsule; Refill: 0    Allergies as of 12/18/2023   No Known Allergies      Medication List        Accurate as of December 18, 2023 11:59 PM. If you have any questions, ask your nurse or doctor.          amLODipine  10 MG tablet Commonly known as: NORVASC  Take 1 tablet (10 mg total) by mouth daily.   atorvastatin  10 MG tablet Commonly known as: LIPITOR Take 1 tablet by mouth once daily   doxycycline  100 MG capsule Commonly known as: Vibramycin  Take 1 capsule (100 mg total) by mouth 2 (two) times daily. Started by: Kathrene Sinopoli   hydrochlorothiazide  25 MG tablet Commonly known as: HYDRODIURIL  Take 1 tablet (25 mg total) by mouth daily. For blood pressure   ibuprofen 200 MG tablet Commonly known as: ADVIL Take 400 mg by mouth every 6 (six) hours as needed for moderate pain.   potassium chloride  SA 20 MEQ tablet Commonly known as: KLOR-CON  M Take 1 tablet (20 mEq total) by mouth daily. For potassium replacement/ supplement   Vitamin D  (Ergocalciferol ) 1.25 MG (50000 UNIT) Caps capsule Commonly known as: DRISDOL  Take 1 capsule by mouth once a week         Follow-up: Return in about 6 months (around 06/19/2024).  Butler Der, M.D.

## 2023-12-18 NOTE — Addendum Note (Signed)
 Addended by: ZOLLIE LOWERS on: 12/18/2023 04:41 PM   Modules accepted: Orders

## 2023-12-19 ENCOUNTER — Ambulatory Visit: Payer: Self-pay | Admitting: Family Medicine

## 2023-12-19 LAB — CBC WITH DIFFERENTIAL/PLATELET
Basophils Absolute: 0 x10E3/uL (ref 0.0–0.2)
Basos: 0 %
EOS (ABSOLUTE): 0.1 x10E3/uL (ref 0.0–0.4)
Eos: 2 %
Hematocrit: 39.6 % (ref 37.5–51.0)
Hemoglobin: 12.5 g/dL — ABNORMAL LOW (ref 13.0–17.7)
Immature Grans (Abs): 0 x10E3/uL (ref 0.0–0.1)
Immature Granulocytes: 0 %
Lymphocytes Absolute: 1.9 x10E3/uL (ref 0.7–3.1)
Lymphs: 28 %
MCH: 27.6 pg (ref 26.6–33.0)
MCHC: 31.6 g/dL (ref 31.5–35.7)
MCV: 87 fL (ref 79–97)
Monocytes Absolute: 0.6 x10E3/uL (ref 0.1–0.9)
Monocytes: 9 %
Neutrophils Absolute: 4.2 x10E3/uL (ref 1.4–7.0)
Neutrophils: 61 %
Platelets: 349 x10E3/uL (ref 150–450)
RBC: 4.53 x10E6/uL (ref 4.14–5.80)
RDW: 14.8 % (ref 11.6–15.4)
WBC: 6.8 x10E3/uL (ref 3.4–10.8)

## 2023-12-19 LAB — LIPID PANEL
Chol/HDL Ratio: 3.6 ratio (ref 0.0–5.0)
Cholesterol, Total: 152 mg/dL (ref 100–199)
HDL: 42 mg/dL (ref >39)
LDL Chol Calc (NIH): 99 mg/dL (ref 0–99)
Triglycerides: 52 mg/dL (ref 0–149)
VLDL Cholesterol Cal: 11 mg/dL (ref 5–40)

## 2023-12-19 LAB — CMP14+EGFR
ALT: 12 IU/L (ref 0–44)
AST: 11 IU/L (ref 0–40)
Albumin: 4.1 g/dL (ref 3.8–4.9)
Alkaline Phosphatase: 115 IU/L (ref 44–121)
BUN/Creatinine Ratio: 11 (ref 9–20)
BUN: 9 mg/dL (ref 6–24)
Bilirubin Total: 0.2 mg/dL (ref 0.0–1.2)
CO2: 21 mmol/L (ref 20–29)
Calcium: 9.5 mg/dL (ref 8.7–10.2)
Chloride: 108 mmol/L — ABNORMAL HIGH (ref 96–106)
Creatinine, Ser: 0.82 mg/dL (ref 0.76–1.27)
Globulin, Total: 2.5 g/dL (ref 1.5–4.5)
Glucose: 87 mg/dL (ref 70–99)
Potassium: 4.2 mmol/L (ref 3.5–5.2)
Sodium: 143 mmol/L (ref 134–144)
Total Protein: 6.6 g/dL (ref 6.0–8.5)
eGFR: 101 mL/min/1.73 (ref >59)

## 2023-12-19 LAB — VITAMIN D 25 HYDROXY (VIT D DEFICIENCY, FRACTURES): Vit D, 25-Hydroxy: 42.8 ng/mL (ref 30.0–100.0)

## 2023-12-19 LAB — URIC ACID: Uric Acid: 7.4 mg/dL (ref 3.8–8.4)

## 2023-12-19 NOTE — Progress Notes (Signed)
Hello Vahe,  Your lab result is normal and/or stable.Some minor variations that are not significant are commonly marked abnormal, but do not represent any medical problem for you.  Best regards, Holt Woolbright, M.D.

## 2024-06-18 ENCOUNTER — Telehealth: Payer: Self-pay | Admitting: Family Medicine

## 2024-06-18 NOTE — Telephone Encounter (Signed)
 Patient states he did not ask for a child psychotherapist just wanted to cancel appointment. LS

## 2024-06-18 NOTE — Telephone Encounter (Unsigned)
 Copied from CRM #[id number]. Topic: Appointments - Appointment Cancel/Reschedule >> Jun 18, 2024 11:51 AM Donna BRAVO wrote: Patient/patient representative is calling to cancel or reschedule an appointment. Refer to attachments for appointment information.  Patient is out of work due to taking care of wife  Patient has no insurance  BP is doing good.  Provided financial assistance phone number [phone]  Patient would also like to speak with social worker regarding getting help with everything.

## 2024-06-19 ENCOUNTER — Ambulatory Visit: Payer: Self-pay | Admitting: Family Medicine
